# Patient Record
Sex: Female | Born: 1985 | Race: White | Hispanic: Refuse to answer | Marital: Married | Smoking: Never smoker
Health system: Southern US, Community
[De-identification: ages and names within clinical notes are randomized; demographics above are authoritative.]

## PROBLEM LIST (undated history)

## (undated) DIAGNOSIS — Z789 Other specified health status: Secondary | ICD-10-CM

## (undated) DIAGNOSIS — O24419 Gestational diabetes mellitus in pregnancy, unspecified control: Secondary | ICD-10-CM

---

## 2017-05-15 LAB — OB RESULTS CONSOLE ABO/RH: RH TYPE: POSITIVE

## 2017-05-15 LAB — OB RESULTS CONSOLE HEPATITIS B SURFACE ANTIGEN: HEP B S AG: NEGATIVE

## 2017-05-15 LAB — OB RESULTS CONSOLE RUBELLA ANTIBODY, IGM: Rubella: IMMUNE

## 2017-05-15 LAB — OB RESULTS CONSOLE ANTIBODY SCREEN: Antibody Screen: NEGATIVE

## 2017-05-15 LAB — OB RESULTS CONSOLE GC/CHLAMYDIA
Chlamydia: NEGATIVE
Gonorrhea: NEGATIVE

## 2017-05-15 LAB — OB RESULTS CONSOLE HIV ANTIBODY (ROUTINE TESTING): HIV: NONREACTIVE

## 2017-05-15 LAB — OB RESULTS CONSOLE RPR: RPR: NONREACTIVE

## 2017-10-12 LAB — OB RESULTS CONSOLE GBS: STREP GROUP B AG: NEGATIVE

## 2017-10-16 ENCOUNTER — Encounter (HOSPITAL_COMMUNITY): Payer: Self-pay | Admitting: *Deleted

## 2017-10-17 ENCOUNTER — Encounter (HOSPITAL_COMMUNITY): Payer: Self-pay

## 2017-10-27 NOTE — Patient Instructions (Signed)
10/31/2017 Darrelyn HillockJillian Chesney  10/27/2017   Your procedure is scheduled on:  10/31/2017  [facility] at  0530AM.  Pick up the phone at the desk and dial [phone]  Call this number if you have problems the morning of surgery:[phone]  Remember:   Do not eat food:(After Midnight) Desps de medianoche.  Do not drink clear liquids: (After Midnight) Desps de medianoche.  Take these medicines the morning of surgery with A SIP OF WATER: none   Do not wear jewelry, make-up or nail polish.  Do not wear lotions, powders, or perfumes. Do not wear deodorant.  Do not shave 48 hours prior to surgery.  Do not bring valuables to the hospital.  [facility] is not   responsible for any belongings or valuables brought to the hospital.  Contacts, dentures or bridgework may not be worn into surgery.  Leave suitcase in the car. After surgery it may be brought to your room.  For patients admitted to the hospital, checkout time is 11:00 AM the day of              discharge.    N/A   Please read over the following fact sheets that you were given:   Surgical Site Infection Prevention

## 2017-10-30 ENCOUNTER — Inpatient Hospital Stay (HOSPITAL_COMMUNITY): Payer: Medicaid Other | Admitting: Anesthesiology

## 2017-10-30 ENCOUNTER — Encounter (HOSPITAL_COMMUNITY): Payer: Self-pay | Admitting: *Deleted

## 2017-10-30 ENCOUNTER — Encounter (HOSPITAL_COMMUNITY)
Admission: AD | Disposition: A | Discharge status: Home / Self Care | Payer: Self-pay | Source: Ambulatory Visit | Attending: Obstetrics and Gynecology

## 2017-10-30 ENCOUNTER — Inpatient Hospital Stay (HOSPITAL_COMMUNITY)
Admission: AD | Admit: 2017-10-30 | Discharge: 2017-11-02 | DRG: 788 | Disposition: A | Discharge status: Home / Self Care | Payer: Medicaid Other | Source: Ambulatory Visit | Attending: Obstetrics and Gynecology | Admitting: Obstetrics and Gynecology

## 2017-10-30 ENCOUNTER — Encounter (HOSPITAL_COMMUNITY)
Admission: RE | Admit: 2017-10-30 | Discharge: 2017-10-30 | Disposition: A | Discharge status: Home / Self Care | Payer: Self-pay | Source: Ambulatory Visit | Attending: Obstetrics and Gynecology | Admitting: Obstetrics and Gynecology

## 2017-10-30 DIAGNOSIS — O34211 Maternal care for low transverse scar from previous cesarean delivery: Secondary | ICD-10-CM | POA: Diagnosis present

## 2017-10-30 DIAGNOSIS — O4292 Full-term premature rupture of membranes, unspecified as to length of time between rupture and onset of labor: Secondary | ICD-10-CM | POA: Diagnosis present

## 2017-10-30 DIAGNOSIS — Z3A38 38 weeks gestation of pregnancy: Secondary | ICD-10-CM

## 2017-10-30 HISTORY — DX: Other specified health status: Z78.9

## 2017-10-30 LAB — CBC
HCT: 33.1 % — ABNORMAL LOW (ref 36.0–46.0)
Hemoglobin: 11.2 g/dL — ABNORMAL LOW (ref 12.0–15.0)
MCH: 28.1 pg (ref 26.0–34.0)
MCHC: 33.8 g/dL (ref 30.0–36.0)
MCV: 83.2 fL (ref 78.0–100.0)
Platelets: 491 10*3/uL — ABNORMAL HIGH (ref 150–400)
RBC: 3.98 MIL/uL (ref 3.87–5.11)
RDW: 14.5 % (ref 11.5–15.5)
WBC: 12.8 10*3/uL — ABNORMAL HIGH (ref 4.0–10.5)

## 2017-10-30 LAB — TYPE AND SCREEN
ABO/RH(D): O POS
Antibody Screen: NEGATIVE

## 2017-10-30 LAB — POCT FERN TEST: POCT FERN TEST: POSITIVE

## 2017-10-30 LAB — ABO/RH: ABO/RH(D): O POS

## 2017-10-30 LAB — RPR: RPR: NONREACTIVE

## 2017-10-30 SURGERY — Surgical Case
Anesthesia: Epidural

## 2017-10-30 MED ORDER — MAGNESIUM HYDROXIDE 400 MG/5ML PO SUSP: 30.0000 mL | ORAL | Status: DC | PRN

## 2017-10-30 MED ORDER — ACETAMINOPHEN 325 MG PO TABS: 650.0000 mg | ORAL_TABLET | ORAL | Status: DC | PRN

## 2017-10-30 MED ORDER — IBUPROFEN 600 MG PO TABS
600.0000 mg | ORAL_TABLET | Freq: Four times a day (QID) | ORAL | Status: DC
  Administered 2017-10-31 – 2017-11-02 (×9): 600 mg via ORAL
  Filled 2017-10-30 (×9): qty 1

## 2017-10-30 MED ORDER — DIPHENHYDRAMINE HCL 25 MG PO CAPS: 25.0000 mg | ORAL_CAPSULE | ORAL | Status: DC | PRN

## 2017-10-30 MED ORDER — ONDANSETRON HCL 4 MG/2ML IJ SOLN
INTRAMUSCULAR | Status: DC | PRN
  Administered 2017-10-30: 4 mg via INTRAVENOUS

## 2017-10-30 MED ORDER — WITCH HAZEL-GLYCERIN EX PADS: 1.0000 "application " | MEDICATED_PAD | CUTANEOUS | Status: DC | PRN

## 2017-10-30 MED ORDER — NALOXONE HCL 4 MG/10ML IJ SOLN: 1.0000 ug/kg/h | INTRAVENOUS | Status: DC | PRN

## 2017-10-30 MED ORDER — FENTANYL CITRATE (PF) 100 MCG/2ML IJ SOLN
INTRAMUSCULAR | Status: AC
  Filled 2017-10-30: qty 2

## 2017-10-30 MED ORDER — ONDANSETRON HCL 4 MG/2ML IJ SOLN
4.0000 mg | Freq: Four times a day (QID) | INTRAMUSCULAR | Status: DC | PRN
  Administered 2017-10-30: 4 mg via INTRAVENOUS
  Filled 2017-10-30: qty 2

## 2017-10-30 MED ORDER — FLEET ENEMA 7-19 GM/118ML RE ENEM: 1.0000 | ENEMA | RECTAL | Status: DC | PRN

## 2017-10-30 MED ORDER — OXYCODONE HCL 5 MG PO TABS: 10.0000 mg | ORAL_TABLET | ORAL | Status: DC | PRN

## 2017-10-30 MED ORDER — NALBUPHINE HCL 10 MG/ML IJ SOLN: 5.0000 mg | Freq: Once | INTRAMUSCULAR | Status: DC | PRN

## 2017-10-30 MED ORDER — PHENYLEPHRINE 40 MCG/ML (10ML) SYRINGE FOR IV PUSH (FOR BLOOD PRESSURE SUPPORT)
PREFILLED_SYRINGE | INTRAVENOUS | Status: DC | PRN
  Administered 2017-10-30 (×15): 80 ug via INTRAVENOUS

## 2017-10-30 MED ORDER — SCOPOLAMINE 1 MG/3DAYS TD PT72
1.0000 | MEDICATED_PATCH | TRANSDERMAL | Status: DC
  Administered 2017-10-30: 1.5 mg via TRANSDERMAL
  Filled 2017-10-30: qty 1

## 2017-10-30 MED ORDER — MEASLES, MUMPS & RUBELLA VAC ~~LOC~~ INJ: 0.5000 mL | INJECTION | Freq: Once | SUBCUTANEOUS | Status: DC

## 2017-10-30 MED ORDER — OXYTOCIN 10 UNIT/ML IJ SOLN
INTRAVENOUS | Status: DC | PRN
  Administered 2017-10-30: 40 [IU] via INTRAVENOUS

## 2017-10-30 MED ORDER — SOD CITRATE-CITRIC ACID 500-334 MG/5ML PO SOLN
30.0000 mL | ORAL | Status: DC | PRN
  Administered 2017-10-30: 30 mL via ORAL
  Filled 2017-10-30: qty 15

## 2017-10-30 MED ORDER — EPHEDRINE 5 MG/ML INJ: 10.0000 mg | INTRAVENOUS | Status: DC | PRN

## 2017-10-30 MED ORDER — COCONUT OIL OIL
1.0000 "application " | TOPICAL_OIL | Status: DC | PRN
  Administered 2017-11-01: 1 via TOPICAL
  Filled 2017-10-30: qty 120

## 2017-10-30 MED ORDER — KETOROLAC TROMETHAMINE 30 MG/ML IJ SOLN: 30.0000 mg | Freq: Four times a day (QID) | INTRAMUSCULAR | Status: AC | PRN

## 2017-10-30 MED ORDER — SIMETHICONE 80 MG PO CHEW: 80.0000 mg | CHEWABLE_TABLET | ORAL | Status: DC | PRN

## 2017-10-30 MED ORDER — PHENYLEPHRINE 40 MCG/ML (10ML) SYRINGE FOR IV PUSH (FOR BLOOD PRESSURE SUPPORT)
80.0000 ug | PREFILLED_SYRINGE | INTRAVENOUS | Status: DC | PRN
  Administered 2017-10-30: 80 ug via INTRAVENOUS
  Filled 2017-10-30: qty 10

## 2017-10-30 MED ORDER — LACTATED RINGERS IV SOLN
INTRAVENOUS | Status: DC
  Administered 2017-10-30 – 2017-10-31 (×2): via INTRAVENOUS

## 2017-10-30 MED ORDER — TETANUS-DIPHTH-ACELL PERTUSSIS 5-2.5-18.5 LF-MCG/0.5 IM SUSP: 0.5000 mL | Freq: Once | INTRAMUSCULAR | Status: DC

## 2017-10-30 MED ORDER — OXYTOCIN 40 UNITS IN LACTATED RINGERS INFUSION - SIMPLE MED: 2.5000 [IU]/h | INTRAVENOUS | Status: DC

## 2017-10-30 MED ORDER — LIDOCAINE-EPINEPHRINE (PF) 2 %-1:200000 IJ SOLN
INTRAMUSCULAR | Status: DC | PRN
  Administered 2017-10-30 (×4): 5 mL via EPIDURAL

## 2017-10-30 MED ORDER — BUTORPHANOL TARTRATE 1 MG/ML IJ SOLN: 1.0000 mg | INTRAMUSCULAR | Status: DC | PRN

## 2017-10-30 MED ORDER — LACTATED RINGERS IV SOLN: 500.0000 mL | Freq: Once | INTRAVENOUS | Status: DC

## 2017-10-30 MED ORDER — DIPHENHYDRAMINE HCL 25 MG PO CAPS: 25.0000 mg | ORAL_CAPSULE | Freq: Four times a day (QID) | ORAL | Status: DC | PRN

## 2017-10-30 MED ORDER — FENTANYL CITRATE (PF) 100 MCG/2ML IJ SOLN
INTRAMUSCULAR | Status: DC | PRN
  Administered 2017-10-30 (×2): 50 ug via INTRAVENOUS

## 2017-10-30 MED ORDER — DIPHENHYDRAMINE HCL 50 MG/ML IJ SOLN: 12.5000 mg | INTRAMUSCULAR | Status: DC | PRN

## 2017-10-30 MED ORDER — LACTATED RINGERS IV SOLN
INTRAVENOUS | Status: DC
  Administered 2017-10-30 (×3): via INTRAVENOUS

## 2017-10-30 MED ORDER — NALOXONE HCL 0.4 MG/ML IJ SOLN: 0.4000 mg | INTRAMUSCULAR | Status: DC | PRN

## 2017-10-30 MED ORDER — MENTHOL 3 MG MT LOZG: 1.0000 | LOZENGE | OROMUCOSAL | Status: DC | PRN

## 2017-10-30 MED ORDER — OXYCODONE-ACETAMINOPHEN 5-325 MG PO TABS: 2.0000 | ORAL_TABLET | ORAL | Status: DC | PRN

## 2017-10-30 MED ORDER — FENTANYL 2.5 MCG/ML BUPIVACAINE 1/10 % EPIDURAL INFUSION (WH - ANES)
14.0000 mL/h | INTRAMUSCULAR | Status: DC | PRN
  Administered 2017-10-30: 14 mL/h via EPIDURAL
  Filled 2017-10-30 (×2): qty 100

## 2017-10-30 MED ORDER — TERBUTALINE SULFATE 1 MG/ML IJ SOLN
0.2500 mg | Freq: Once | INTRAMUSCULAR | Status: DC | PRN
Start: 2017-10-30 — End: 2017-10-30

## 2017-10-30 MED ORDER — DIBUCAINE 1 % RE OINT: 1.0000 "application " | TOPICAL_OINTMENT | RECTAL | Status: DC | PRN

## 2017-10-30 MED ORDER — MEPERIDINE HCL 25 MG/ML IJ SOLN: 6.2500 mg | INTRAMUSCULAR | Status: DC | PRN

## 2017-10-30 MED ORDER — OXYTOCIN 40 UNITS IN LACTATED RINGERS INFUSION - SIMPLE MED
1.0000 m[IU]/min | INTRAVENOUS | Status: DC
  Administered 2017-10-30: 1 m[IU]/min via INTRAVENOUS
  Filled 2017-10-30: qty 1000

## 2017-10-30 MED ORDER — KETOROLAC TROMETHAMINE 30 MG/ML IJ SOLN
30.0000 mg | Freq: Four times a day (QID) | INTRAMUSCULAR | Status: AC | PRN
  Administered 2017-10-30: 30 mg via INTRAMUSCULAR

## 2017-10-30 MED ORDER — OXYCODONE-ACETAMINOPHEN 5-325 MG PO TABS: 1.0000 | ORAL_TABLET | ORAL | Status: DC | PRN

## 2017-10-30 MED ORDER — LACTATED RINGERS IV SOLN
500.0000 mL | INTRAVENOUS | Status: DC | PRN
  Administered 2017-10-30: 1000 mL via INTRAVENOUS

## 2017-10-30 MED ORDER — FENTANYL CITRATE (PF) 100 MCG/2ML IJ SOLN
25.0000 ug | INTRAMUSCULAR | Status: DC | PRN
  Administered 2017-10-30: 25 ug via INTRAVENOUS
  Administered 2017-10-30: 50 ug via INTRAVENOUS
  Administered 2017-10-30: 25 ug via INTRAVENOUS

## 2017-10-30 MED ORDER — LIDOCAINE HCL (PF) 1 % IJ SOLN
INTRAMUSCULAR | Status: DC | PRN
  Administered 2017-10-30 (×2): 4 mL

## 2017-10-30 MED ORDER — NALBUPHINE HCL 10 MG/ML IJ SOLN: 5.0000 mg | INTRAMUSCULAR | Status: DC | PRN

## 2017-10-30 MED ORDER — SENNOSIDES-DOCUSATE SODIUM 8.6-50 MG PO TABS
2.0000 | ORAL_TABLET | ORAL | Status: DC
  Administered 2017-11-01 (×2): 2 via ORAL
  Filled 2017-10-30 (×2): qty 2

## 2017-10-30 MED ORDER — ONDANSETRON HCL 4 MG/2ML IJ SOLN
4.0000 mg | Freq: Three times a day (TID) | INTRAMUSCULAR | Status: DC | PRN
  Administered 2017-10-31: 4 mg via INTRAVENOUS
  Filled 2017-10-30 (×2): qty 2

## 2017-10-30 MED ORDER — LIDOCAINE HCL (PF) 1 % IJ SOLN: 30.0000 mL | INTRAMUSCULAR | Status: DC | PRN

## 2017-10-30 MED ORDER — PHENYLEPHRINE 8 MG IN D5W 100 ML (0.08MG/ML) PREMIX OPTIME: INJECTION | INTRAVENOUS | Status: DC | PRN

## 2017-10-30 MED ORDER — PHENYLEPHRINE 40 MCG/ML (10ML) SYRINGE FOR IV PUSH (FOR BLOOD PRESSURE SUPPORT)
80.0000 ug | PREFILLED_SYRINGE | INTRAVENOUS | Status: DC | PRN
  Filled 2017-10-30: qty 10

## 2017-10-30 MED ORDER — CEFAZOLIN SODIUM-DEXTROSE 2-4 GM/100ML-% IV SOLN
2.0000 g | INTRAVENOUS | Status: AC
  Administered 2017-10-30: 2 g via INTRAVENOUS
  Filled 2017-10-30: qty 100

## 2017-10-30 MED ORDER — OXYTOCIN 40 UNITS IN LACTATED RINGERS INFUSION - SIMPLE MED: 2.5000 [IU]/h | INTRAVENOUS | Status: AC

## 2017-10-30 MED ORDER — LACTATED RINGERS IV SOLN
INTRAVENOUS | Status: DC
  Administered 2017-10-30: 16:00:00 via INTRAVENOUS

## 2017-10-30 MED ORDER — METOCLOPRAMIDE HCL 5 MG/ML IJ SOLN
10.0000 mg | Freq: Once | INTRAMUSCULAR | Status: DC | PRN
  Filled 2017-10-30: qty 2

## 2017-10-30 MED ORDER — OXYCODONE HCL 5 MG PO TABS
5.0000 mg | ORAL_TABLET | ORAL | Status: DC | PRN
  Administered 2017-11-01 (×2): 5 mg via ORAL
  Filled 2017-10-30 (×2): qty 1

## 2017-10-30 MED ORDER — FENTANYL 2.5 MCG/ML BUPIVACAINE 1/10 % EPIDURAL INFUSION (WH - ANES)
14.0000 mL/h | INTRAMUSCULAR | Status: DC | PRN
  Administered 2017-10-30: 14 mL/h via EPIDURAL

## 2017-10-30 MED ORDER — PRENATAL MULTIVITAMIN CH
1.0000 | ORAL_TABLET | Freq: Every day | ORAL | Status: DC
  Administered 2017-10-31 – 2017-11-01 (×2): 1 via ORAL
  Filled 2017-10-30 (×2): qty 1

## 2017-10-30 MED ORDER — SODIUM CHLORIDE 0.9 % IR SOLN
Status: DC | PRN
  Administered 2017-10-30: 1

## 2017-10-30 MED ORDER — SODIUM CHLORIDE 0.9% FLUSH: 3.0000 mL | INTRAVENOUS | Status: DC | PRN

## 2017-10-30 MED ORDER — ZOLPIDEM TARTRATE 5 MG PO TABS: 5.0000 mg | ORAL_TABLET | Freq: Every evening | ORAL | Status: DC | PRN

## 2017-10-30 MED ORDER — MORPHINE SULFATE-NACL 0.5-0.9 MG/ML-% IV SOSY
PREFILLED_SYRINGE | INTRAVENOUS | Status: DC | PRN
  Administered 2017-10-30: 4 mg via EPIDURAL

## 2017-10-30 MED ORDER — OXYTOCIN BOLUS FROM INFUSION: 500.0000 mL | Freq: Once | INTRAVENOUS | Status: DC

## 2017-10-30 SURGICAL SUPPLY — 33 items
BENZOIN TINCTURE PRP APPL 2/3 (GAUZE/BANDAGES/DRESSINGS) ×3 IMPLANT
CHLORAPREP W/TINT 26ML (MISCELLANEOUS) ×3 IMPLANT
CLAMP CORD UMBIL (MISCELLANEOUS) IMPLANT
CLOSURE STERI STRIP 1/2 X4 (GAUZE/BANDAGES/DRESSINGS) ×3 IMPLANT
CLOTH BEACON ORANGE TIMEOUT ST (SAFETY) ×3 IMPLANT
DRSG OPSITE POSTOP 4X10 (GAUZE/BANDAGES/DRESSINGS) ×3 IMPLANT
ELECT REM PT RETURN 9FT ADLT (ELECTROSURGICAL) ×3
ELECTRODE REM PT RTRN 9FT ADLT (ELECTROSURGICAL) ×1 IMPLANT
EXTRACTOR VACUUM KIWI (MISCELLANEOUS) IMPLANT
EXTRACTOR VACUUM M CUP 4 TUBE (SUCTIONS) IMPLANT
EXTRACTOR VACUUM M CUP 4' TUBE (SUCTIONS)
GLOVE BIOGEL PI IND STRL 7.0 (GLOVE) ×1 IMPLANT
GLOVE BIOGEL PI INDICATOR 7.0 (GLOVE) ×2
GLOVE ORTHO TXT STRL SZ7.5 (GLOVE) ×3 IMPLANT
GOWN STRL REUS W/TWL LRG LVL3 (GOWN DISPOSABLE) ×6 IMPLANT
KIT ABG SYR 3ML LUER SLIP (SYRINGE) IMPLANT
NEEDLE HYPO 25X5/8 SAFETYGLIDE (NEEDLE) ×3 IMPLANT
NS IRRIG 1000ML POUR BTL (IV SOLUTION) ×3 IMPLANT
PACK C SECTION WH (CUSTOM PROCEDURE TRAY) ×3 IMPLANT
PAD OB MATERNITY 4.3X12.25 (PERSONAL CARE ITEMS) ×3 IMPLANT
PENCIL SMOKE EVAC W/HOLSTER (ELECTROSURGICAL) ×3 IMPLANT
RTRCTR C-SECT PINK 25CM LRG (MISCELLANEOUS) ×3 IMPLANT
SUT CHROMIC 1 CTX 36 (SUTURE) ×6 IMPLANT
SUT PLAIN 0 NONE (SUTURE) IMPLANT
SUT PLAIN 2 0 XLH (SUTURE) IMPLANT
SUT VIC AB 0 CT1 27 (SUTURE) ×4
SUT VIC AB 0 CT1 27XBRD ANBCTR (SUTURE) ×2 IMPLANT
SUT VIC AB 2-0 CT1 (SUTURE) ×3 IMPLANT
SUT VIC AB 2-0 CT1 27 (SUTURE) ×2
SUT VIC AB 2-0 CT1 TAPERPNT 27 (SUTURE) ×1 IMPLANT
SUT VIC AB 4-0 KS 27 (SUTURE) IMPLANT
TOWEL OR 17X24 6PK STRL BLUE (TOWEL DISPOSABLE) ×3 IMPLANT
TRAY FOLEY BAG SILVER LF 14FR (SET/KITS/TRAYS/PACK) ×3 IMPLANT

## 2017-10-30 NOTE — Progress Notes (Signed)
Feeling some ctx with epidural Afeb, VSS FHT- 120-130, mod variability, some accels, some variable decels, Cat II, ctx not tracing consistently on 8 mu/min pitocin VE-foley still snug in cervix Continue pitocin with foley, monitor progress and FHT

## 2017-10-30 NOTE — Anesthesia Procedure Notes (Signed)
Epidural Patient location during procedure: OB Start time: 10/30/2017 5:58 AM End time: 10/30/2017 6:08 AM  Staffing Anesthesiologist: Lewie LoronGermeroth, Cedrica Brune, MD Performed: anesthesiologist   Preanesthetic Checklist Completed: patient identified, pre-op evaluation, timeout performed, IV checked, risks and benefits discussed and monitors and equipment checked  Epidural Patient position: sitting Prep: site prepped and draped and DuraPrep Patient monitoring: heart rate, continuous pulse ox and blood pressure Approach: midline Location: L3-L4 Injection technique: LOR air and LOR saline  Needle:  Needle type: Tuohy  Needle gauge: 17 G Needle length: 9 cm Needle insertion depth: 6 cm Catheter type: closed end flexible Catheter size: 19 Gauge Catheter at skin depth: 11 cm Test dose: negative  Assessment Sensory level: T8 Events: blood not aspirated, injection not painful, no injection resistance, negative IV test and no paresthesia  Additional Notes Reason for block:procedure for pain

## 2017-10-30 NOTE — MAU Note (Signed)
PT states water broke at 2:30am-clear fluid. Contractions that she has not timed. Pt denies vaginal bleeding. Reports good fetal movement. States cervix was 2cm 2 weeks ago.

## 2017-10-30 NOTE — Transfer of Care (Signed)
Immediate Anesthesia Transfer of Care Note  Patient: Robin Conley  Procedure(s) Performed: REPEAT CESAREAN SECTION (N/A )  Patient Location: PACU  Anesthesia Type:Epidural  Level of Consciousness: sedated  Airway & Oxygen Therapy: Patient Spontanous Breathing  Post-op Assessment: Report given to RN  Post vital signs: Reviewed and stable  Last Vitals:  Vitals Value Taken Time  BP    Temp    Pulse 91 10/30/2017  5:45 PM  Resp 17 10/30/2017  5:45 PM  SpO2 100 % 10/30/2017  5:45 PM  Vitals shown include unvalidated device data.  Last Pain:  Vitals:   10/30/17 1601  TempSrc: Oral  PainSc:          Complications: No apparent anesthesia complications

## 2017-10-30 NOTE — Progress Notes (Signed)
Pt turned to rt side, pt feel nauseated

## 2017-10-30 NOTE — Anesthesia Preprocedure Evaluation (Addendum)
Anesthesia Evaluation  Patient identified by MRN, date of birth, ID band Patient awake    Reviewed: Allergy & Precautions, NPO status , Patient's Chart, lab work & pertinent test results  Airway Mallampati: II  TM Distance: >3 FB Neck ROM: Full    Dental no notable dental hx.    Pulmonary neg pulmonary ROS,    Pulmonary exam normal breath sounds clear to auscultation       Cardiovascular negative cardio ROS Normal cardiovascular exam Rhythm:Regular Rate:Normal     Neuro/Psych negative neurological ROS  negative psych ROS   GI/Hepatic negative GI ROS, Neg liver ROS, GERD  ,  Endo/Other  negative endocrine ROS  Renal/GU negative Renal ROS     Musculoskeletal negative musculoskeletal ROS (+)   Abdominal   Peds  Hematology negative hematology ROS (+)   Anesthesia Other Findings   Reproductive/Obstetrics (+) Pregnancy Previous C/section                            Anesthesia Physical Anesthesia Plan  ASA: II and emergent  Anesthesia Plan: Epidural   Post-op Pain Management:    Induction:   PONV Risk Score and Plan: Scopolamine patch - Pre-op, Ondansetron, Dexamethasone and Treatment may vary due to age or medical condition  Airway Management Planned: Natural Airway  Additional Equipment:   Intra-op Plan:   Post-operative Plan:   Informed Consent: I have reviewed the patients History and Physical, chart, labs and discussed the procedure including the risks, benefits and alternatives for the proposed anesthesia with the patient or authorized representative who has indicated his/her understanding and acceptance.     Plan Discussed with: CRNA, Anesthesiologist and Surgeon  Anesthesia Plan Comments: (Repeat C/Section for FHR decels. Will use epidural for C/Section. M. Malen GauzeFoster, MD)       Anesthesia Quick Evaluation

## 2017-10-30 NOTE — Op Note (Signed)
Preoperative diagnosis: Intrauterine pregnancy at 38+ weeks, previous c-section x 2, PROM, FHR decels Postoperative diagnosis: Same Procedure: Repeat low transverse cesarean section without extensions Surgeon: Lavina Hammanodd Cierria Height M.D. Anesthesia: Epidural Findings: Patient had normal gravid anatomy and delivered a viable female infant with Apgars of 8 and 9 weight pending Estimated blood loss: 841 cc Specimens: Placenta sent to labor and delivery Complications: None  Procedure in detail: The patient was taken to the operating room and placed in the dorsosupine position. Her previously placed epidural was dosed appropriately. Abdomen was then prepped and draped in the usual sterile fashion, a foley catheter had previously been inserted. The level of her anesthesia was found to be adequate. Abdomen was entered via a standard Pfannenstiel incision through her previous scar. Once the peritoneal cavity was entered the Alexis disposable self-retaining retractor was placed and good visualization was achieved, omentum was taken down from the anterior peritoneum with Bovie to free it, no other adhesions were noted. A 4 cm transverse incision was then made in the lower uterine segment pushing the bladder inferior. Once the uterine cavity was entered the incision was extended digitally. The fetal vertex was grasped and delivered through the incision atraumatically. Mouth and nares were suctioned. The remainder of the infant then delivered atraumatically. Cord was doubly clamped and cut and the infant handed to the awaiting pediatric team. Cord blood was obtained. The placenta delivered spontaneously. Uterus was wiped dry with clean lap pad and all clots and debris were removed. Uterine incision was inspected and found to be free of extensions. Uterine incision was closed in 1 layer with running locking #1 Chromic.  Uterine incision was inspected and found to be hemostatic. Bleeding from serosal edges was controlled with  electrocautery. The Alexis retractor was removed. Subfascial space was irrigated and made hemostatic with electrocautery. Peritoneum was closed with 2-0 Vicryl.  Fascia was closed in running fashion starting at both ends and meeting in the middle with 0 Vicryl. Subcutaneous tissue was then irrigated and made hemostatic with electrocautery, then closed with running 2-0 plain gut. Skin was closed with running 4-0 Vicryl subcuticular suture followed by steri-strips and a sterile dressing. Patient tolerated the procedure well and was taken to the recovery in stable condition. Counts were correct x2, she received Ancef 2 g IV at the beginning of the procedure and she had PAS hose on throughout the procedure.

## 2017-10-30 NOTE — Progress Notes (Signed)
FHR with repetitive late decels, still moderate variability Discussed with patient, still remote from vaginal delivery, recommended repeat c-section and she agrees.  Discussed procedure and risks.  Will proceed when OR is ready.

## 2017-10-30 NOTE — Progress Notes (Signed)
Comfortable with epidural Afeb, VSS FHT- cat I, 120s, irreg ctx VE-1/30/-3, vtx, intracervical foley placed Will try foley bulb and low dose pitocin, monitor progress

## 2017-10-30 NOTE — H&P (Signed)
Robin Conley is a 32 y.o. female [phone] at 38+ with history of LTCS x 2, with SROM.  Pt desires TOLAC, has consent signed, have discussed w pt r/b/a.  Pt dated by early [place].  Declined genetic screening, flu and Tdap.  Normal anatomy on follow up US.  Pt's rLTCS set up for 3/26 if didn't labor prior.  ROM confirmed in MAU.  D/W pt r/b/a of TOLAC/VBAC - paper signed, questions answered.  States ROM at 2:30am, clear.     OB History    Gravida  4   Para  3   Term  3   Preterm      AB      Living  3     SAB      TAB      Ectopic      Multiple      Live Births  3         G1 06/2008 SVD female 5#8 G2 10/2009 LTCS for fetal stress, 6#, female G3 04/2012 LTCS as scheduled repeat, per pt not offered TOLAC G4 present  No abn pap, last 05/15/17 +STD - h/o Chl  Past Medical History:  Diagnosis Date  . Medical history non-contributory   RA per office records  Past Surgical History:  Procedure Laterality Date  . CESAREAN SECTION    LTCS x 2  Family History: family history includes Diabetes in her mother; Heart disease in her father and mother; Hypertension in her father and mother. Social History:  reports that she has never smoked. She has never used smokeless tobacco. She reports that she does not drink alcohol or use drugs.single, manager  Meds PNV All NKDA     Maternal Diabetes: No Genetic Screening: Declined Maternal Ultrasounds/Referrals: Normal Fetal Ultrasounds or other Referrals:  None Maternal Substance Abuse:  No Significant Maternal Medications:  None Significant Maternal Lab Results:  Lab values include: Group B Strep negative Other Comments:  None  Review of Systems  Constitutional: Negative.   HENT: Negative.   Eyes: Negative.   Respiratory: Negative.   Cardiovascular: Negative.   Gastrointestinal: Negative.   Genitourinary: Negative.   Musculoskeletal: Negative.   Skin: Negative.   Neurological: Negative.   Psychiatric/Behavioral: Negative.     Maternal Medical History:  Reason for admission: Rupture of membranes.   Contractions: Frequency: regular.   Perceived severity is moderate.    Fetal activity: Perceived fetal activity is normal.    Prenatal complications: H/o LTCS x 2  Prenatal Complications - Diabetes: none.   FHTs 130's, mod var, + accels, category 1 toco Q2-59min  Dilation: Fingertip Effacement (%): Thick Exam by:: Rockwell Germany RN  Blood pressure 121/80, pulse 97, temperature 97.9 F (36.6 C), temperature source Oral, resp. rate 20, height 5\' 2"  (1.575 m), weight 87.5 kg (193 lb), last menstrual period 05/15/2017, SpO2 98 %. Maternal Exam:  Uterine Assessment: Contraction frequency is regular.   Abdomen: Patient reports no abdominal tenderness. Surgical scars: low transverse.   Fundal height is appropriate for gestation.   Estimated fetal weight is 8#.   Fetal presentation: vertex  Introitus: Normal vulva. Normal vagina.    Physical Exam  Constitutional: She is oriented to person, place, and time. She appears well-developed and well-nourished.  HENT:  Head: Normocephalic and atraumatic.  Cardiovascular: Normal rate and regular rhythm.  Respiratory: Effort normal and breath sounds normal. No respiratory distress. She has no wheezes.  GI: Soft. Bowel sounds are normal. She exhibits no distension. There is no tenderness.  Musculoskeletal: Normal range of motion.  Neurological: She is alert and oriented to person, place, and time.  Skin: Skin is warm and dry.  Psychiatric: She has a normal mood and affect. Her behavior is normal.    Prenatal labs: ABO, Rh: O/Positive/-- (10/08 0000) Antibody: Negative (10/08 0000) Rubella: Immune (10/08 0000) RPR: Nonreactive (10/08 0000)  HBsAg: Negative (10/08 0000)  HIV: Non-reactive (10/08 0000)  GBS:  negative  Hgb 11.4/Plt 316/Ur cx neg/Chl neg/GC neg/Pap WNL/ Hgb electro WNL/glucola 100  Nl anat x limited heart and face, female, aqnt plac F/u  completes anatomy  Assessment/Plan: 31yo G4P3003 at 38+ with SROM, h/o LTCS x 2 Unfavorable cervix - will place foley bulb if no cervical change D/w pt r/b/a of TOLAC - paper signed Cont close monitoring. Epidural, pt requests before foley bulb placed   SVE exam FT/50/-3, vtx  Naod Sweetland Bovard-Stuckert 10/30/2017, 4:51 AM

## 2017-10-31 ENCOUNTER — Other Ambulatory Visit: Payer: Self-pay

## 2017-10-31 ENCOUNTER — Inpatient Hospital Stay (HOSPITAL_COMMUNITY): Admission: RE | Admit: 2017-10-31 | Payer: Self-pay | Source: Ambulatory Visit | Admitting: Obstetrics and Gynecology

## 2017-10-31 ENCOUNTER — Encounter (HOSPITAL_COMMUNITY): Payer: Self-pay | Admitting: Obstetrics and Gynecology

## 2017-10-31 LAB — CBC
HCT: 32 % — ABNORMAL LOW (ref 36.0–46.0)
Hemoglobin: 10.7 g/dL — ABNORMAL LOW (ref 12.0–15.0)
MCH: 27.9 pg (ref 26.0–34.0)
MCHC: 33.4 g/dL (ref 30.0–36.0)
MCV: 83.6 fL (ref 78.0–100.0)
Platelets: 434 10*3/uL — ABNORMAL HIGH (ref 150–400)
RBC: 3.83 MIL/uL — ABNORMAL LOW (ref 3.87–5.11)
RDW: 14.5 % (ref 11.5–15.5)
WBC: 16.7 10*3/uL — ABNORMAL HIGH (ref 4.0–10.5)

## 2017-10-31 NOTE — Addendum Note (Signed)
Addendum  created 10/31/17 0739 by Shanon PayorGregory, Savannaha Stonerock M, CRNA   Sign clinical note

## 2017-10-31 NOTE — Lactation Note (Signed)
This note was copied from a baby's chart. Lactation Consultation Note  Patient Name: Robin Darrelyn HillockJillian Sayre ZOXWR'UToday's Date: 10/31/2017 Reason for consult: Initial assessment   P4 Baby 19 hours old.  First time breastfeeding. Baby has been sleepy.  Encouraged mother to place baby STS if she does not wake to cue and undress her. Reviewed hand expression prior to latching. Assisted w/ latching in football hold, breast compression. Intermittent swallows observed. Mom encouraged to feed baby 8-12 times/24 hours and with feeding cues.  Mom made aware of O/P services, breastfeeding support groups, community resources, and our phone # for post-discharge questions.     Maternal Data Has patient been taught Hand Expression?: Yes Does the patient have breastfeeding experience prior to this delivery?: No  Feeding Feeding Type: Breast Fed  LATCH Score Latch: Repeated attempts needed to sustain latch, nipple held in mouth throughout feeding, stimulation needed to elicit sucking reflex.  Audible Swallowing: A few with stimulation  Type of Nipple: Everted at rest and after stimulation  Comfort (Breast/Nipple): Soft / non-tender  Hold (Positioning): Assistance needed to correctly position infant at breast and maintain latch.  LATCH Score: 7  Interventions Interventions: Breast feeding basics reviewed;Assisted with latch;Skin to skin;Hand express;Adjust position;Support pillows  Lactation Tools Discussed/Used     Consult Status Consult Status: Follow-up Date: 11/01/17 Follow-up type: In-patient    Dahlia ByesBerkelhammer, Ruth [facility] 10/31/2017, 12:40 PM

## 2017-10-31 NOTE — Anesthesia Postprocedure Evaluation (Signed)
Anesthesia Post Note  Patient: Robin Conley  Procedure(s) Performed: REPEAT CESAREAN SECTION (N/A )     Patient location during evaluation: Mother Baby Anesthesia Type: Epidural Level of consciousness: awake and alert and oriented Pain management: pain level controlled Vital Signs Assessment: post-procedure vital signs reviewed and stable Respiratory status: spontaneous breathing and nonlabored ventilation Cardiovascular status: stable Postop Assessment: no headache, no backache, patient able to bend at knees, no signs of nausea or vomiting and adequate PO intake Anesthetic complications: no    Last Vitals:  Vitals:   10/31/17 0418 10/31/17 0649  BP:  (!) 101/57  Pulse:  69  Resp:  16  Temp:  36.7 C  SpO2: 100% 98%    Last Pain:  Vitals:   10/31/17 0649  TempSrc: Oral  PainSc: 4    Pain Goal:                 Madison HickmanGREGORY,Robin Conley

## 2017-10-31 NOTE — Anesthesia Postprocedure Evaluation (Signed)
Anesthesia Post Note  Patient: Robin HillockJillian Tesoro  Procedure(s) Performed: REPEAT CESAREAN SECTION (N/A )     Anesthesia Post Evaluation  Last Vitals:  Vitals:   10/31/17 0330 10/31/17 0418  BP:    Pulse:    Resp:    Temp:    SpO2: 100% 100%    Last Pain:  Vitals:   10/31/17 0239  TempSrc: Oral  PainSc: 0-No pain   Pain Goal:                 Anahita Cua A.

## 2017-10-31 NOTE — Progress Notes (Signed)
Subjective: Postpartum Day 1: Cesarean Delivery Patient reports incisional pain, tolerating PO and no problems voiding.  Nl lochia, pain controlled  Objective: Vital signs in last 24 hours: Temp:  [97.3 F (36.3 C)-98.3 F (36.8 C)] 98.1 F (36.7 C) (03/26 0649) Pulse Rate:  [56-105] 69 (03/26 0649) Resp:  [13-21] 16 (03/26 0649) BP: (85-125)/(47-105) 101/57 (03/26 0649) SpO2:  [96 %-100 %] 98 % (03/26 0649)  Physical Exam:  General: alert and no distress Lochia: appropriate Uterine Fundus: firm Incision: healing well DVT Evaluation: No evidence of DVT seen on physical exam.  Recent Labs    10/30/17 0445 10/31/17 0536  HGB 11.2* 10.7*  HCT 33.1* 32.0*    Assessment/Plan: Status post Cesarean section. Doing well postoperatively.  Continue current care.  Valine Drozdowski Bovard-Stuckert 10/31/2017, 8:02 AM

## 2017-11-01 LAB — BIRTH TISSUE RECOVERY COLLECTION (PLACENTA DONATION)

## 2017-11-01 NOTE — Plan of Care (Signed)
  Problem: Elimination: Goal: Will not experience complications related to bowel motility Outcome: Progressing Goal: Will not experience complications related to urinary retention Outcome: Progressing Note:  Pt has voided normal amts throughout day.     Problem: Activity: Goal: Will verbalize the importance of balancing activity with adequate rest periods Outcome: Progressing Goal: Ability to tolerate increased activity will improve 11/01/2017 1737 by Claudette LawsBurgess, Keirstin Musil D, RN Note:  Pt ambulates in room without difficulty.  Has been encouraged to ambulate in hallway this shift, however, she has not. 11/01/2017 1736 by Claudette LawsBurgess, Verenice Westrich D, RN Outcome: Progressing   Problem: Life Cycle: Goal: Chance of risk for complications during the postpartum period will decrease 11/01/2017 1737 by Princess PernaBurgess, Weyman Bogdon D, RN Note:  Fundas firm, lochia amts WNL. 11/01/2017 1736 by Claudette LawsBurgess, Deyonna Fitzsimmons D, RN Outcome: Progressing   Problem: Role Relationship: Goal: Ability to demonstrate positive interaction with newborn will improve 11/01/2017 1737 by Princess PernaBurgess, Christapher Gillian D, RN Note:  Pt has been observed holding infant skin to skin & caring for infant throughout the day. 11/01/2017 1736 by Claudette LawsBurgess, Shley Dolby D, RN Outcome: Progressing   Problem: Skin Integrity: Goal: Demonstration of wound healing without infection will improve 11/01/2017 1737 by Claudette LawsBurgess, Benna Arno D, RN Note:  No new drainage marked on honeycomb dressing.  Pt denies s/s of infection at incision site. 11/01/2017 1736 by Princess PernaBurgess, Nialah Saravia D, RN Outcome: Progressing

## 2017-11-01 NOTE — Progress Notes (Signed)
Subjective: Postpartum Day 2 Cesarean Delivery Patient reports incisional pain, tolerating PO and no problems voiding.  States is a bit more sore today  Objective: Vital signs in last 24 hours: Temp:  [97.6 F (36.4 C)-98.4 F (36.9 C)] 98.4 F (36.9 C) (03/27 0600) Pulse Rate:  [67-98] 74 (03/27 0600) Resp:  [16-18] 18 (03/27 0600) BP: (103-115)/(58-75) 115/75 (03/27 0600) SpO2:  [97 %-100 %] 98 % (03/26 1810)  Physical Exam:  General: alert and cooperative Lochia: appropriate Uterine Fundus: firm Incision: small amount of old drainage, intact and dry   Recent Labs    10/30/17 0445 10/31/17 0536  HGB 11.2* 10.7*  HCT 33.1* 32.0*    Assessment/Plan: Status post Cesarean section. Doing well postoperatively.  Continue current care. D/w pt duramorph has now worn off and ok for occasional oxycodone as needed  Oliver PilaKathy W Nial Hawe 11/01/2017, 9:24 AM

## 2017-11-01 NOTE — Plan of Care (Signed)
Progressing appropriately. Encouraged to call for assistance as needed with feeding, and for LATCH assessment. 

## 2017-11-01 NOTE — Plan of Care (Signed)
  Problem: Pain Managment: Goal: General experience of comfort will improve 11/01/2017 1740 by Claudette LawsBurgess, Kannen Moxey D, RN Outcome: Progressing Note:  Pt has required one dose of Oxy IR during this shift. Pain predominantly managed with around the clock Ibuprofen. 11/01/2017 1739 by Princess PernaBurgess, Bravery Ketcham D, RN Reactivated

## 2017-11-02 MED ORDER — OXYCODONE HCL 5 MG PO TABS: 5.0000 mg | ORAL_TABLET | ORAL | 0 refills | Status: DC | PRN

## 2017-11-02 MED ORDER — IBUPROFEN 600 MG PO TABS: 600.0000 mg | ORAL_TABLET | Freq: Four times a day (QID) | ORAL | 0 refills | Status: DC

## 2017-11-02 NOTE — Lactation Note (Signed)
This note was copied from a baby's chart. Lactation Consultation Note  Patient Name: Robin Conley: 11/02/2017 Reason for consult: Follow-up assessment;Infant weight loss  LC visit prior to discharge:  Infant is now 5265 hours of age. LC reviewed chart and noticed that mother has been bottle feeding since last night.  Mother stated she is giving her breasts a "break"  But plans to put the infant to breast as soon as possible.  LC offered to assess nipples/breasts but mother declined stating she felt okay with her plan.  She has recently finished pumping 35 mls of breast milk and is going to use that for the next feeding.  At present, infant is sleeping on back in [place].  Mother's breasts are full and filling with no engorgement noted.  Reviewed sore nipple/engorgement prevention and treatment with her.  Provided comfort gels with instructions for use.    Mother has a DEBP for home use.Mom encouraged to feed baby 8-12 times/24 hours and with feeding cues. Mom made aware of O/P services, breastfeeding support groups, community resources, and our phone # for post-discharge questions.    Consult Status Consult Status: Complete Conley: 11/02/17 Follow-up type: Call as needed    Jerah Esty R Lissie Hinesley 11/02/2017, 10:32 AM

## 2017-11-02 NOTE — Progress Notes (Signed)
POD #3 Sore but doing ok Afeb, VSS Abd- soft, fundus firm, incision intact D/c home

## 2017-11-02 NOTE — Discharge Instructions (Signed)
As per discharge pamphlet °

## 2017-11-02 NOTE — Discharge Summary (Signed)
OB Discharge Summary     Patient Name: Robin Conley DOB: 28-Apr-1986 MRN: [id number]  Date of admission: 10/30/2017 Delivering MD: Jackelyn Knife, Sanaia Jasso   Date of discharge: 11/02/2017  Admitting diagnosis: 39 WEEKS ROM  Intrauterine pregnancy: [redacted]w[redacted]d     Secondary diagnosis:  Active Problems:   Indication for care in labor or delivery      Discharge diagnosis: Term Pregnancy Delivered and FHR decels, previous c-section x 2                                                                                                Hospital course:  Onset of Labor With Unplanned C/S  32 y.o. yo [id number] at [redacted]w[redacted]d was admitted in Latent Labor on 10/30/2017 with SROM.  Had intracervical foley placed to ripen cervix with VE 1 cm dilated. Patient had a labor course significant for FHR decels. Membrane Rupture Time/Date: 2:30 AM ,10/30/2017   The patient went for cesarean section due to FHR decels remote from delivery, and delivered a Viable infant,10/30/2017  Details of operation can be found in separate operative note. Patient had an uncomplicated postpartum course.  She is ambulating,tolerating a regular diet, passing flatus, and urinating well.  Patient is discharged home in stable condition 11/02/17.  Physical exam  Vitals:   10/31/17 1810 11/01/17 0600 11/01/17 1833 11/02/17 0539  BP: 107/62 115/75 114/64 113/70  Pulse: 98 74 60 60  Resp: 18 18 18 18   Temp: 97.6 F (36.4 C) 98.4 F (36.9 C) 97.9 F (36.6 C) 98.4 F (36.9 C)  TempSrc: Oral Oral Oral Oral  SpO2: 98%  98%   Weight:      Height:       General: alert Lochia: appropriate Uterine Fundus: firm Incision: Healing well with no significant drainage  Labs: Lab Results  Component Value Date   WBC 16.7 (H) 10/31/2017   HGB 10.7 (L) 10/31/2017   HCT 32.0 (L) 10/31/2017   MCV 83.6 10/31/2017   PLT 434 (H) 10/31/2017   No flowsheet data found.  Discharge instruction: per After Visit Summary and "Baby and Me Booklet".  After visit  meds:  Allergies as of 11/02/2017   No Known Allergies     Medication List    STOP taking these medications   calcium carbonate 500 MG chewable tablet Commonly known as:  TUMS - dosed in mg elemental calcium     TAKE these medications   ibuprofen 600 MG tablet Commonly known as:  ADVIL,MOTRIN Take 1 tablet (600 mg total) by mouth every 6 (six) hours.   oxyCODONE 5 MG immediate release tablet Commonly known as:  Oxy IR/ROXICODONE Take 1 tablet (5 mg total) by mouth every 4 (four) hours as needed for severe pain.   PRENATAL PO Take 1 tablet by mouth daily.       Diet: routine diet  Activity: Advance as tolerated. Pelvic rest for 6 weeks.   Outpatient follow up:2 weeks  Newborn Data: Live born female  Birth Weight: 8 lb 5.2 oz (3775 g) APGAR: 8, 9  Newborn Delivery   Birth date/time:  10/30/2017 16:51:00  Delivery type:  C-Section, Low Transverse C-section categorization:  Repeat     Baby Feeding: Breast Disposition:home with mother   11/02/2017 Zenaida Nieceodd D Jamarien Rodkey, MD

## 2017-11-02 NOTE — Progress Notes (Signed)
Discharge instructions given to patient.  She has been seen by lactation, wic and visiting home nurse.  She has no complaints and seems ready for discharge.

## 2019-08-29 ENCOUNTER — Other Ambulatory Visit (HOSPITAL_COMMUNITY)
Admission: RE | Admit: 2019-08-29 | Discharge: 2019-08-29 | Disposition: A | Discharge status: Home / Self Care | Payer: 59 | Source: Ambulatory Visit | Attending: Family Medicine | Admitting: Family Medicine

## 2019-08-29 ENCOUNTER — Ambulatory Visit (INDEPENDENT_AMBULATORY_CARE_PROVIDER_SITE_OTHER): Payer: 59 | Admitting: Family Medicine

## 2019-08-29 ENCOUNTER — Encounter: Payer: Self-pay | Admitting: Family Medicine

## 2019-08-29 ENCOUNTER — Other Ambulatory Visit: Payer: Self-pay

## 2019-08-29 VITALS — BP 104/64 | HR 86 | Wt 189.1 lb

## 2019-08-29 DIAGNOSIS — Z98891 History of uterine scar from previous surgery: Secondary | ICD-10-CM | POA: Insufficient documentation

## 2019-08-29 DIAGNOSIS — O09892 Supervision of other high risk pregnancies, second trimester: Secondary | ICD-10-CM

## 2019-08-29 DIAGNOSIS — O0932 Supervision of pregnancy with insufficient antenatal care, second trimester: Secondary | ICD-10-CM

## 2019-08-29 DIAGNOSIS — O09899 Supervision of other high risk pregnancies, unspecified trimester: Secondary | ICD-10-CM

## 2019-08-29 DIAGNOSIS — O0930 Supervision of pregnancy with insufficient antenatal care, unspecified trimester: Secondary | ICD-10-CM | POA: Diagnosis present

## 2019-08-29 DIAGNOSIS — Z3A24 24 weeks gestation of pregnancy: Secondary | ICD-10-CM

## 2019-08-29 NOTE — Progress Notes (Signed)
Subjective:  Robin Conley is a [id number] [redacted]w[redacted]d being seen today for her first obstetrical visit.  Her obstetrical history is significant for vaginal delivery, followed by 3 cesarean sections. No other complications to pregnancies. FOB in spouse. Unplanned, but desired pregnancy.. Patient does intend to breast feed. Pregnancy history fully reviewed.  Patient reports no complaints.  BP 104/64   Pulse 86   Wt 189 lb 1.3 oz (85.8 kg)   LMP 03/12/2019 (Exact Date)   BMI 34.58 kg/m   HISTORY: OB History  Gravida Para Term Preterm AB Living  5 4 4     4   SAB TAB Ectopic Multiple Live Births        0 4    # Outcome Date GA Lbr Len/2nd Weight Sex Delivery Anes PTL Lv  5 Current           4 Term 10/30/17 [redacted]w[redacted]d  8 lb 5.2 oz (3.775 kg) F CS-LTranv EPI  LIV  3 Term 2013 [redacted]w[redacted]d  7 lb 11 oz (3.487 kg) F CS-LTranv   LIV  2 Term 2011 [redacted]w[redacted]d  6 lb (2.722 kg) M CS-LTranv   LIV  1 Term 2009 [redacted]w[redacted]d  5 lb 8 oz (2.495 kg) M Vag-Spont   LIV    Past Medical History:  Diagnosis Date  . Medical history non-contributory     Past Surgical History:  Procedure Laterality Date  . CESAREAN SECTION    . CESAREAN SECTION N/A 10/30/2017   Procedure: REPEAT CESAREAN SECTION;  Surgeon: Cheri Fowler, MD;  Location: [facility];  Service: Obstetrics;  Laterality: N/A;  Tracey RNFA    Family History  Problem Relation Age of Onset  . Diabetes Mother   . Hypertension Mother   . Heart disease Mother   . Hypertension Father   . Heart disease Father      Exam  BP 104/64   Pulse 86   Wt 189 lb 1.3 oz (85.8 kg)   LMP 03/12/2019 (Exact Date)   BMI 34.58 kg/m   Chaperone present during exam  CONSTITUTIONAL: Well-developed, well-nourished female in no acute distress.  HENT:  Normocephalic, atraumatic, External right and left ear normal. Oropharynx is clear and moist EYES: Conjunctivae and EOM are normal. Pupils are equal, round, and reactive to light. No scleral icterus.  NECK: Normal range of  motion, supple, no masses.  Normal thyroid.  CARDIOVASCULAR: Normal heart rate noted, regular rhythm RESPIRATORY: Clear to auscultation bilaterally. Effort and breath sounds normal, no problems with respiration noted. BREASTS: deferred ABDOMEN: Soft, normal bowel sounds, no distention noted.  No tenderness, rebound or guarding.  PELVIC: deferred MUSCULOSKELETAL: Normal range of motion. No tenderness.  No cyanosis, clubbing, or edema.  2+ distal pulses. SKIN: Skin is warm and dry. No rash noted. Not diaphoretic. No erythema. No pallor. NEUROLOGIC: Alert and oriented to person, place, and time. Normal reflexes, muscle tone coordination. No cranial nerve deficit noted. PSYCHIATRIC: Normal mood and affect. Normal behavior. Normal judgment and thought content.    Assessment:    Pregnancy: [id number] Patient Active Problem List   Diagnosis Date Noted  . Supervision of other high risk pregnancy, antepartum 08/29/2019  . Indication for care in labor or delivery 10/30/2017      Plan:   1. Supervision of other high risk pregnancy, antepartum FHT and FH normal. PNL Desires panorama Will schedule [place]  2. Insufficient prenatal care, unspecified trimester  3. History of cesarean delivery Plan for repeat cesarean delivery.     Problem list  reviewed and updated. 75% of 30 min visit spent on counseling and coordination of care.     Levie Heritage 08/29/2019

## 2019-08-30 LAB — GC/CHLAMYDIA PROBE AMP (~~LOC~~) NOT AT ARMC
Chlamydia: NEGATIVE
Comment: NEGATIVE
Comment: NORMAL
Neisseria Gonorrhea: NEGATIVE

## 2019-08-31 LAB — CULTURE, OB URINE

## 2019-08-31 LAB — URINE CULTURE, OB REFLEX: Organism ID, Bacteria: NO GROWTH

## 2019-09-10 LAB — OBSTETRIC PANEL, INCLUDING HIV
Antibody Screen: NEGATIVE
Basophils Absolute: 0 10*3/uL (ref 0.0–0.2)
Basos: 0 %
EOS (ABSOLUTE): 0 10*3/uL (ref 0.0–0.4)
Eos: 0 %
HIV Screen 4th Generation wRfx: NONREACTIVE
Hematocrit: 32.7 % — ABNORMAL LOW (ref 34.0–46.6)
Hemoglobin: 11.1 g/dL (ref 11.1–15.9)
Hepatitis B Surface Ag: NEGATIVE
Immature Grans (Abs): 0.1 10*3/uL (ref 0.0–0.1)
Immature Granulocytes: 1 %
Lymphocytes Absolute: 2 10*3/uL (ref 0.7–3.1)
Lymphs: 21 %
MCH: 29.5 pg (ref 26.6–33.0)
MCHC: 33.9 g/dL (ref 31.5–35.7)
MCV: 87 fL (ref 79–97)
Monocytes Absolute: 0.5 10*3/uL (ref 0.1–0.9)
Monocytes: 5 %
Neutrophils Absolute: 7.1 10*3/uL — ABNORMAL HIGH (ref 1.4–7.0)
Neutrophils: 73 %
Platelets: 330 10*3/uL (ref 150–450)
RBC: 3.76 x10E6/uL — ABNORMAL LOW (ref 3.77–5.28)
RDW: 13.1 % (ref 11.7–15.4)
RPR Ser Ql: NONREACTIVE
Rh Factor: POSITIVE
Rubella Antibodies, IGG: 2.41 index (ref >0.99)
WBC: 9.8 10*3/uL (ref 3.4–10.8)

## 2019-09-10 LAB — HEMOGLOBIN A1C
Est. average glucose Bld gHb Est-mCnc: 97 mg/dL
Hgb A1c MFr Bld: 5 % (ref 4.8–5.6)

## 2019-09-10 LAB — HEMOGLOBINOPATHY EVALUATION
Ferritin: 23 ng/mL (ref 15–150)
Hgb A2 Quant: 1.9 % (ref 1.8–3.2)
Hgb A: 98.1 % (ref 96.4–98.8)
Hgb C: 0 %
Hgb F Quant: 0 % (ref 0.0–2.0)
Hgb S: 0 %
Hgb Solubility: NEGATIVE
Hgb Variant: 0 %

## 2019-09-10 LAB — SMN1 COPY NUMBER ANALYSIS (SMA CARRIER SCREENING)

## 2019-09-16 ENCOUNTER — Ambulatory Visit (HOSPITAL_COMMUNITY): Payer: 59

## 2019-09-16 ENCOUNTER — Other Ambulatory Visit (HOSPITAL_COMMUNITY): Payer: 59

## 2019-09-17 ENCOUNTER — Ambulatory Visit (HOSPITAL_COMMUNITY)
Admission: RE | Admit: 2019-09-17 | Discharge: 2019-09-17 | Disposition: A | Discharge status: Home / Self Care | Payer: 59 | Source: Ambulatory Visit | Attending: Obstetrics and Gynecology | Admitting: Obstetrics and Gynecology

## 2019-09-17 ENCOUNTER — Other Ambulatory Visit: Payer: Self-pay

## 2019-09-17 ENCOUNTER — Encounter (HOSPITAL_COMMUNITY): Payer: Self-pay | Admitting: *Deleted

## 2019-09-17 ENCOUNTER — Ambulatory Visit (HOSPITAL_COMMUNITY): Payer: 59 | Admitting: *Deleted

## 2019-09-17 DIAGNOSIS — O0930 Supervision of pregnancy with insufficient antenatal care, unspecified trimester: Secondary | ICD-10-CM | POA: Insufficient documentation

## 2019-09-17 DIAGNOSIS — O0932 Supervision of pregnancy with insufficient antenatal care, second trimester: Secondary | ICD-10-CM | POA: Diagnosis not present

## 2019-09-17 DIAGNOSIS — O34219 Maternal care for unspecified type scar from previous cesarean delivery: Secondary | ICD-10-CM

## 2019-09-17 DIAGNOSIS — O99212 Obesity complicating pregnancy, second trimester: Secondary | ICD-10-CM | POA: Diagnosis not present

## 2019-09-17 DIAGNOSIS — Z98891 History of uterine scar from previous surgery: Secondary | ICD-10-CM | POA: Diagnosis present

## 2019-09-17 DIAGNOSIS — O09899 Supervision of other high risk pregnancies, unspecified trimester: Secondary | ICD-10-CM

## 2019-09-17 DIAGNOSIS — Z3A27 27 weeks gestation of pregnancy: Secondary | ICD-10-CM | POA: Diagnosis not present

## 2019-10-01 ENCOUNTER — Encounter: Payer: Self-pay | Admitting: Advanced Practice Midwife

## 2019-10-01 ENCOUNTER — Other Ambulatory Visit: Payer: Self-pay

## 2019-10-01 ENCOUNTER — Ambulatory Visit (INDEPENDENT_AMBULATORY_CARE_PROVIDER_SITE_OTHER): Payer: 59 | Admitting: Advanced Practice Midwife

## 2019-10-01 DIAGNOSIS — O09899 Supervision of other high risk pregnancies, unspecified trimester: Secondary | ICD-10-CM

## 2019-10-01 DIAGNOSIS — Z3A29 29 weeks gestation of pregnancy: Secondary | ICD-10-CM

## 2019-10-01 DIAGNOSIS — O34219 Maternal care for unspecified type scar from previous cesarean delivery: Secondary | ICD-10-CM

## 2019-10-01 NOTE — Patient Instructions (Signed)

## 2019-10-01 NOTE — Progress Notes (Signed)
   PRENATAL VISIT NOTE  Subjective:  Robin Conley is a 34 y.o. G5P4004 at [redacted]w[redacted]d being seen today for ongoing prenatal care.  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  Patient reports no complaints.  Contractions: Not present. Vag. Bleeding: None.  Movement: Present. Denies leaking of fluid.   The following portions of the patient's history were reviewed and updated as appropriate: allergies, current medications, past family history, past medical history, past social history, past surgical history and problem list.   Objective:   Vitals:   10/01/19 1057  BP: 102/61  Pulse: 90  Weight: 196 lb (88.9 kg)    Fetal Status: Fetal Heart Rate (bpm): 139   Movement: Present     General:  Alert, oriented and cooperative. Patient is in no acute distress.  Skin: Skin is warm and dry. No rash noted.   Cardiovascular: Normal heart rate noted  Respiratory: Normal respiratory effort, no problems with respiration noted  Abdomen: Soft, gravid, appropriate for gestational age.  Pain/Pressure: Present     Pelvic: Cervical exam deferred        Extremities: Normal range of motion.  Edema: Trace  Mental Status: Normal mood and affect. Normal behavior. Normal judgment and thought content.   Assessment and Plan:  Pregnancy: G5P4004 at 105w0d 1. Supervision of other high risk pregnancy, antepartum      No complaints "It's my 5th rodeo"      Reviewed location of new hospital and our staffing/workflow      Will do GTT this week  Preterm labor symptoms and general obstetric precautions including but not limited to vaginal bleeding, contractions, leaking of fluid and fetal movement were reviewed in detail with the patient. Please refer to After Visit Summary for other counseling recommendations.    Return in about 2 weeks (around 10/15/2019) for [facility].  Future Appointments  Date Time Provider Department Center  10/03/2019  8:30 AM [facility] NURSE [facility] None  10/29/2019  9:30 AM Aviva Signs, CNM [facility] None    Wynelle Bourgeois, CNM

## 2019-10-03 ENCOUNTER — Other Ambulatory Visit: Payer: Self-pay

## 2019-10-03 ENCOUNTER — Ambulatory Visit: Payer: 59

## 2019-10-03 DIAGNOSIS — O0930 Supervision of pregnancy with insufficient antenatal care, unspecified trimester: Secondary | ICD-10-CM

## 2019-10-03 DIAGNOSIS — O09899 Supervision of other high risk pregnancies, unspecified trimester: Secondary | ICD-10-CM

## 2019-10-03 NOTE — Progress Notes (Signed)
Patient sent to lab for 28 week labwork. Charm Stenner RN 

## 2019-10-04 LAB — CBC
Hematocrit: 31.8 % — ABNORMAL LOW (ref 34.0–46.6)
Hemoglobin: 10.7 g/dL — ABNORMAL LOW (ref 11.1–15.9)
MCH: 29.7 pg (ref 26.6–33.0)
MCHC: 33.6 g/dL (ref 31.5–35.7)
MCV: 88 fL (ref 79–97)
Platelets: 373 10*3/uL (ref 150–450)
RBC: 3.6 x10E6/uL — ABNORMAL LOW (ref 3.77–5.28)
RDW: 13.1 % (ref 11.7–15.4)
WBC: 9.9 10*3/uL (ref 3.4–10.8)

## 2019-10-04 LAB — HIV ANTIBODY (ROUTINE TESTING W REFLEX): HIV Screen 4th Generation wRfx: NONREACTIVE

## 2019-10-04 LAB — GLUCOSE TOLERANCE, 2 HOURS W/ 1HR
Glucose, 1 hour: 159 mg/dL (ref 65–179)
Glucose, 2 hour: 109 mg/dL (ref 65–152)
Glucose, Fasting: 95 mg/dL — ABNORMAL HIGH (ref 65–91)

## 2019-10-04 LAB — RPR: RPR Ser Ql: NONREACTIVE

## 2019-10-08 ENCOUNTER — Other Ambulatory Visit: Payer: Self-pay | Admitting: Family Medicine

## 2019-10-08 DIAGNOSIS — O24419 Gestational diabetes mellitus in pregnancy, unspecified control: Secondary | ICD-10-CM

## 2019-10-08 MED ORDER — ACCU-CHEK SOFTCLIX LANCETS MISC: 1.0000 | Freq: Four times a day (QID) | 12 refills | Status: DC

## 2019-10-08 MED ORDER — ACCU-CHEK SMARTVIEW VI STRP: ORAL_STRIP | 12 refills | Status: DC

## 2019-10-08 MED ORDER — ACCU-CHEK NANO SMARTVIEW W/DEVICE KIT: 1.0000 | PACK | 0 refills | Status: DC

## 2019-10-11 ENCOUNTER — Other Ambulatory Visit: Payer: Self-pay

## 2019-10-11 ENCOUNTER — Other Ambulatory Visit: Payer: Self-pay | Admitting: Lactation Services

## 2019-10-11 DIAGNOSIS — O24419 Gestational diabetes mellitus in pregnancy, unspecified control: Secondary | ICD-10-CM

## 2019-10-11 MED ORDER — ACCU-CHEK GUIDE W/DEVICE KIT: 1.0000 | PACK | Freq: Once | 0 refills | Status: AC

## 2019-10-11 MED ORDER — ACCU-CHEK GUIDE VI STRP: ORAL_STRIP | 12 refills | Status: DC

## 2019-10-11 NOTE — Progress Notes (Signed)
Blood Glucose supply orders changed to meter that is covered by patients insurance at Pharmacy request.

## 2019-10-15 ENCOUNTER — Other Ambulatory Visit: Payer: 59

## 2019-10-29 ENCOUNTER — Other Ambulatory Visit: Payer: Self-pay

## 2019-10-29 ENCOUNTER — Encounter: Payer: Self-pay | Admitting: Advanced Practice Midwife

## 2019-10-29 ENCOUNTER — Ambulatory Visit (INDEPENDENT_AMBULATORY_CARE_PROVIDER_SITE_OTHER): Payer: 59 | Admitting: Advanced Practice Midwife

## 2019-10-29 DIAGNOSIS — O24419 Gestational diabetes mellitus in pregnancy, unspecified control: Secondary | ICD-10-CM

## 2019-10-29 DIAGNOSIS — O09893 Supervision of other high risk pregnancies, third trimester: Secondary | ICD-10-CM

## 2019-10-29 DIAGNOSIS — O09899 Supervision of other high risk pregnancies, unspecified trimester: Secondary | ICD-10-CM

## 2019-10-29 DIAGNOSIS — O24415 Gestational diabetes mellitus in pregnancy, controlled by oral hypoglycemic drugs: Secondary | ICD-10-CM

## 2019-10-29 MED ORDER — METFORMIN HCL 500 MG PO TABS: 500.0000 mg | ORAL_TABLET | Freq: Every day | ORAL | Status: DC

## 2019-10-29 NOTE — Progress Notes (Signed)
   PRENATAL VISIT NOTE  Subjective:  Robin Conley is a 34 y.o. G5P4004 at [redacted]w[redacted]d being seen today for ongoing prenatal care.  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  Patient reports no complaints.  Contractions: Not present. Vag. Bleeding: None.  Movement: Present. Denies leaking of fluid.   States Fast[place] are often elevated.  One as high as 159 (after eating birthday cake night before) but many 120s.  Postprandial levels "all about 100".  States does not follow diet all the time  The following portions of the patient's history were reviewed and updated as appropriate: allergies, current medications, past family history, past medical history, past social history, past surgical history and problem list.   Objective:   Vitals:   10/29/19 0918  BP: 101/62  Pulse: 90  Weight: 197 lb (89.4 kg)    Fetal Status: Fetal Heart Rate (bpm): 140 Fundal Height: 33 cm Movement: Present     General:  Alert, oriented and cooperative. Patient is in no acute distress.  Skin: Skin is warm and dry. No rash noted.   Cardiovascular: Normal heart rate noted  Respiratory: Normal respiratory effort, no problems with respiration noted  Abdomen: Soft, gravid, appropriate for gestational age.  Pain/Pressure: Present     Pelvic: Cervical exam deferred        Extremities: Normal range of motion.  Edema: Trace  Mental Status: Normal mood and affect. Normal behavior. Normal judgment and thought content.   Assessment and Plan:  Pregnancy: G5P4004 at [redacted]w[redacted]d 1. Supervision of other high risk pregnancy, antepartum      Doing well  2. Gestational diabetes mellitus (GDM) in third trimester, gestational diabetes method of control unspecified  Will start her on Metformin 500mg  qHS for FBS control  Preterm labor symptoms and general obstetric precautions including but not limited to vaginal bleeding, contractions, leaking of fluid and fetal movement were reviewed in detail with the patient. Please refer to After Visit Summary for other counseling recommendations.   Return in about 2 weeks (around 11/12/2019).  Future Appointments  Date Time Provider Department Center  11/14/2019 10:00 AM 01/14/2020, MD [facility] None    Willodean Rosenthal, CNM

## 2019-10-29 NOTE — Patient Instructions (Signed)
Gestational Diabetes Mellitus, Self Care When you have gestational diabetes (gestational diabetes mellitus), you must make sure your blood sugar (glucose) stays in a healthy range. You can do this with:  Nutrition.  Exercise.  Lifestyle changes.  Medicines or insulin, if needed.  Support from your doctors and others. If you get treated for this condition, it may not hurt you or your unborn baby (fetus). If you do not get treated for this condition, it may cause problems that can hurt you or your unborn baby. If you get gestational diabetes, you are:  More likely to get it if you get pregnant again.  More likely to develop type 2 diabetes in the future. How to stay aware of blood sugar   Check your blood sugar every day while you are pregnant. Check it as often as told.  Call your doctor if your blood sugar is above your goal numbers for two tests in a row. Your doctor will set personal treatment goals for you. Generally, you should have these blood sugar levels:  Before meals, or after not eating for a long time (fasting or preprandial): at or below 95 mg/dL (5.3 mmol/L).  After meals (postprandial): ? One hour after a meal: at or below 140 mg/dL (7.8 mmol/L). ? Two hours after a meal: at or below 120 mg/dL (6.7 mmol/L).  A1c (hemoglobin A1c) level: 6-6.5%. How to manage high and low blood sugar Signs of high blood sugar High blood sugar is called hyperglycemia. Know the early signs of high blood sugar. Signs may include:  Feeling: ? Thirsty. ? Hungry. ? Very tired.  Needing to pee (urinate) more than usual.  Blurry vision. Signs of low blood sugar Low blood sugar is called hypoglycemia. This is when blood sugar is at or below 70 mg/dL (3.9 mmol/L). Signs may include:  Feeling: ? Hungry. ? Worried or nervous (anxious). ? Sweaty and clammy. ? Confused. ? Dizzy. ? Sleepy. ? Sick to your stomach (nauseous).  Having: ? A fast heartbeat. ? A headache. ? A change  in your vision. ? Tingling or no feeling (numbness) around your mouth, lips, or tongue. ? Jerky movements that you cannot control (seizure).  Having trouble with: ? Moving (coordination). ? Sleeping. ? Passing out (fainting). ? Getting upset easily (irritability). Treating low blood sugar To treat low blood sugar, eat or drink something sugary right away. If you can think clearly and swallow safely, follow the 15:15 rule:  Take 15 grams of a fast-acting carb (carbohydrate). Talk with your doctor about how much you should take.  Some fast-acting carbs are: ? Sugar tablets (glucose pills). Take 3-4 glucose pills. ? 6-8 pieces of hard candy. ? 4-6 oz (120-150 mL) of fruit juice. ? 4-6 oz (120-150 mL) of regular (not diet) soda. ? 1 Tbsp (15 mL) honey or sugar.  Check your blood sugar 15 minutes after you take the carb.  If your blood sugar is still at or below 70 mg/dL (3.9 mmol/L), take 15 grams of a carb again.  If your blood sugar does not go above 70 mg/dL (3.9 mmol/L) after 3 tries, get help right away.  After your blood sugar goes back to normal, eat a meal or a snack within 1 hour. Treating very low blood sugar If your blood sugar is at or below 54 mg/dL (3 mmol/L), you have very low blood sugar (severe hypoglycemia). This is an emergency. Do not wait to see if the symptoms will go away. Get medical help right  away. Call your local emergency services (911 in the [place] If you have very low blood sugar and you cannot eat or drink, you may need a glucagon shot (injection). A family member or friend should learn how to check your blood sugar and how to give you a glucagon shot. Ask your doctor if you need to have a glucagon shot kit at home. Follow these instructions at home: Medicine  Take your insulin and diabetes medicines as told.  If your doctor says you should take more or less insulin or medicines, do this exactly as told.  Do not run out of insulin or  medicines. Food   Make healthy food choices. These include: ? Chicken, fish, egg whites, and beans. ? Oats, whole wheat, bulgur, brown rice, quinoa, and millet. ? Fresh fruits and vegetables. ? Low-fat dairy products. ? Nuts, avocado, olive oil, and canola oil.  Meet with a food specialist (dietitian). He or she can help you make an eating plan that is right for you.  Follow instructions from your doctor about what you cannot eat or drink.  Drink enough fluid to keep your pee (urine) pale yellow.  Eat healthy snacks between healthy meals.  Keep track of carbs that you eat. Do this by reading food labels and learning food serving sizes.  Follow your sick day plan when you cannot eat or drink normally. Make this plan with your doctor so it is ready to use. Activity  Exercise for 30 or more minutes a day, or as much as your doctor recommends.  Talk with your doctor before you start a new exercise or activity. Your doctor may need to tell you to change: ? How much insulin or medicines you take. ? How much food you eat. Lifestyle  Do not drink alcohol.  Do not use any tobacco products. These include cigarettes, chewing tobacco, and e-cigarettes. If you need help quitting, ask your doctor.  Learn how to deal with stress. If you need help with this, ask your doctor. Body care  Stay up to date with your shots (immunizations).  Brush your teeth and gums two times a day. Floss one or more times a day.  Go to the dentist one or more times every 6 months.  Stay at a healthy weight while you are pregnant. General instructions  Take over-the-counter and prescription medicines only as told by your doctor.  Ask your doctor about risks of high blood pressure in pregnancy (preeclampsia and eclampsia).  Share your diabetes care plan with: ? Your work or school. ? People you live with.  Check your pee for ketones: ? When you are sick. ? As told by your doctor.  Carry a card or  wear jewelry that says you have diabetes.  Keep all follow-up visits as told by your doctor. This is important. Care after giving birth  Have your blood sugar checked 4-12 weeks after you give birth.  Get checked for diabetes one or more times during 3 years. Questions to ask your doctor  Do I need to meet with a diabetes educator?  Where can I find a support group for people with gestational diabetes? Where to find more information To learn more about diabetes, visit:  American Diabetes Association: www.diabetes.org  Centers for Disease Control and Prevention (CDC): www.cdc.gov Summary  Check your blood sugar (glucose) every day while you are pregnant. Check it as often as told.  Take your insulin and diabetes medicines as told.  Keep all follow-up visits as   told by your doctor. This is important.  Have your blood sugar checked 4-12 weeks after you give birth. This information is not intended to replace advice given to you by your health care provider. Make sure you discuss any questions you have with your health care provider. Document Revised: 01/15/2018 Document Reviewed: 08/28/2015 Elsevier Patient Education  2020 Elsevier Inc.  

## 2019-11-14 ENCOUNTER — Ambulatory Visit (INDEPENDENT_AMBULATORY_CARE_PROVIDER_SITE_OTHER): Payer: 59 | Admitting: Obstetrics & Gynecology

## 2019-11-14 ENCOUNTER — Other Ambulatory Visit: Payer: Self-pay

## 2019-11-14 VITALS — BP 105/61 | HR 85 | Wt 197.0 lb

## 2019-11-14 DIAGNOSIS — O09899 Supervision of other high risk pregnancies, unspecified trimester: Secondary | ICD-10-CM

## 2019-11-14 DIAGNOSIS — O24410 Gestational diabetes mellitus in pregnancy, diet controlled: Secondary | ICD-10-CM

## 2019-11-14 DIAGNOSIS — Z98891 History of uterine scar from previous surgery: Secondary | ICD-10-CM

## 2019-11-14 DIAGNOSIS — Z3A35 35 weeks gestation of pregnancy: Secondary | ICD-10-CM

## 2019-11-14 DIAGNOSIS — O3429 Maternal care due to uterine scar from other previous surgery: Secondary | ICD-10-CM

## 2019-11-14 NOTE — Progress Notes (Signed)
   PRENATAL VISIT NOTE  Subjective:  Robin Conley is a 34 y.o. G5P4004 at [redacted]w[redacted]d being seen today for ongoing prenatal care.  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  Patient reports no complaints.  Contractions: Not present. Vag. Bleeding: None.  Movement: Present. Denies leaking of fluid.   The following portions of the patient's history were reviewed and updated as appropriate: allergies, current medications, past family history, past medical history, past social history, past surgical history and problem list.   Objective:   Vitals:   11/14/19 0948  BP: 105/61  Pulse: 85  Weight: 197 lb (89.4 kg)    Fetal Status: Fetal Heart Rate (bpm): 151   Movement: Present     General:  Alert, oriented and cooperative. Patient is in no acute distress.  Skin: Skin is warm and dry. No rash noted.   Cardiovascular: Normal heart rate noted  Respiratory: Normal respiratory effort, no problems with respiration noted  Abdomen: Soft, gravid, appropriate for gestational age.  Pain/Pressure: Absent     Pelvic: Cervical exam deferred        Extremities: Normal range of motion.  Edema: Trace  Mental Status: Normal mood and affect. Normal behavior. Normal judgment and thought content.   Assessment and Plan:  Pregnancy: G5P4004 at [redacted]w[redacted]d 1. Supervision of other high risk pregnancy, antepartum FH and FHR are WNL  2. Diet controlled gestational diabetes mellitus (GDM) in third trimester Pt did not have log or meter.  Reports fasting of ~96 as the highest.  2 hour PP ~120s Pt encouraged to bring log next visit.   3. 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  Pt will think about it. Does not want more children.       4. History of cesarean delivery For repeat at 39 weeks. Scheduled.     Preterm labor symptoms and general obstetric precautions including but not limited to vaginal bleeding, contractions, leaking of fluid and fetal movement were reviewed in detail with the patient. Please refer to After Visit Summary for other counseling recommendations.   Return in about 1 week (around 11/21/2019) for in person.  No future appointments.  Willodean Rosenthal, MD

## 2019-11-20 NOTE — Addendum Note (Signed)
Addended by: Jaynie Collins A on: 11/20/2019 12:11 PM   Modules accepted: Orders, SmartSet

## 2019-11-21 ENCOUNTER — Ambulatory Visit (INDEPENDENT_AMBULATORY_CARE_PROVIDER_SITE_OTHER): Payer: Medicaid Other | Admitting: Family Medicine

## 2019-11-21 ENCOUNTER — Other Ambulatory Visit (HOSPITAL_COMMUNITY)
Admission: RE | Admit: 2019-11-21 | Discharge: 2019-11-21 | Disposition: A | Discharge status: Home / Self Care | Payer: 59 | Source: Ambulatory Visit | Attending: Family Medicine | Admitting: Family Medicine

## 2019-11-21 ENCOUNTER — Other Ambulatory Visit: Payer: Self-pay

## 2019-11-21 VITALS — BP 101/61 | HR 84 | Wt 199.0 lb

## 2019-11-21 DIAGNOSIS — O34219 Maternal care for unspecified type scar from previous cesarean delivery: Secondary | ICD-10-CM

## 2019-11-21 DIAGNOSIS — Z98891 History of uterine scar from previous surgery: Secondary | ICD-10-CM

## 2019-11-21 DIAGNOSIS — O09899 Supervision of other high risk pregnancies, unspecified trimester: Secondary | ICD-10-CM | POA: Diagnosis present

## 2019-11-21 DIAGNOSIS — O24415 Gestational diabetes mellitus in pregnancy, controlled by oral hypoglycemic drugs: Secondary | ICD-10-CM

## 2019-11-21 DIAGNOSIS — Z3A36 36 weeks gestation of pregnancy: Secondary | ICD-10-CM

## 2019-11-21 MED ORDER — FREESTYLE LIBRE 2 SENSOR MISC: 1.0000 [IU] | 1 refills | Status: DC

## 2019-11-21 MED ORDER — FREESTYLE LIBRE 2 READER DEVI: 1.0000 [IU] | Freq: Four times a day (QID) | 0 refills | Status: DC

## 2019-11-21 NOTE — Progress Notes (Signed)
Subjective:  Robin Conley is a 34 y.o. G5P4004 at [redacted]w[redacted]d being seen today for ongoing prenatal care.  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  GDM: Patient taking metformin.  Reports no hypoglycemic episodes.  Tolerating medication well. She is not testing often - only had five checks in the past week - some of these are borderline.  Patient reports no complaints.  Contractions: Not present. Vag. Bleeding: None.  Movement: Present. Denies leaking of fluid.   The following portions of the patient's history were reviewed and updated as appropriate: allergies, current medications, past family history, past medical history, past social history, past surgical history and problem list. Problem list updated.  Objective:   Vitals:   11/21/19 1009  BP: 101/61  Pulse: 84  Weight: 199 lb (90.3 kg)    Fetal Status: Fetal Heart Rate (bpm): 148   Movement: Present     General:  Alert, oriented and cooperative. Patient is in no acute distress.  Skin: Skin is warm and dry. No rash noted.   Cardiovascular: Normal heart rate noted  Respiratory: Normal respiratory effort, no problems with respiration noted  Abdomen: Soft, gravid, appropriate for gestational age. Pain/Pressure: Present     Pelvic: Vag. Bleeding: None     Cervical exam deferred        Extremities: Normal range of motion.  Edema: Trace  Mental Status: Normal mood and affect. Normal behavior. Normal judgment and thought content.   Urinalysis:      Assessment and Plan:  Pregnancy: G5P4004 at [redacted]w[redacted]d  1. Supervision of other high risk pregnancy, antepartum FHT and FH normal - Cervicovaginal ancillary only( [place]) - Culture, beta strep (group b only)  2.  Gestational diabetes mellitus (GDM) in third trimester controlled on oral hypoglycemic drug Will prescribe CGM to see if this helps with compliance. Discussed how to use.    3. History of cesarean delivery Scheduled for repeat   Preterm labor symptoms and general obstetric precautions including but not limited to vaginal bleeding, contractions, leaking of fluid and fetal movement were reviewed in detail with the patient. Please refer to After Visit Summary for other counseling recommendations.  No follow-ups on file.   Levie Heritage, DO

## 2019-11-22 LAB — CERVICOVAGINAL ANCILLARY ONLY
Chlamydia: NEGATIVE
Comment: NEGATIVE
Comment: NORMAL
Neisseria Gonorrhea: NEGATIVE

## 2019-11-25 LAB — CULTURE, BETA STREP (GROUP B ONLY): Strep Gp B Culture: NEGATIVE

## 2019-11-26 ENCOUNTER — Encounter: Payer: Self-pay | Admitting: Advanced Practice Midwife

## 2019-11-26 ENCOUNTER — Other Ambulatory Visit: Payer: Self-pay

## 2019-11-26 ENCOUNTER — Ambulatory Visit (INDEPENDENT_AMBULATORY_CARE_PROVIDER_SITE_OTHER): Payer: Medicaid Other | Admitting: Advanced Practice Midwife

## 2019-11-26 VITALS — BP 104/64 | HR 90 | Wt 198.0 lb

## 2019-11-26 DIAGNOSIS — O24415 Gestational diabetes mellitus in pregnancy, controlled by oral hypoglycemic drugs: Secondary | ICD-10-CM

## 2019-11-26 DIAGNOSIS — Z3A37 37 weeks gestation of pregnancy: Secondary | ICD-10-CM

## 2019-11-26 DIAGNOSIS — O09899 Supervision of other high risk pregnancies, unspecified trimester: Secondary | ICD-10-CM

## 2019-11-26 NOTE — Progress Notes (Signed)
   PRENATAL VISIT NOTE  Subjective:  Robin Conley is a 34 y.o. G5P4004 at [redacted]w[redacted]d being seen today for ongoing prenatal care.  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  Patient reports no complaints.  Contractions: Irregular. Vag. Bleeding: None.  Movement: Present. Denies leaking of fluid.   The following portions of the patient's history were reviewed and updated as appropriate: allergies, current medications, past family history, past medical history, past social history, past surgical history and problem list.   Objective:   Vitals:   11/26/19 1050  BP: 104/64  Pulse: 90  Weight: 198 lb (89.8 kg)    Fetal Status: Fetal Heart Rate (bpm): 135   Movement: Present     General:  Alert, oriented and cooperative. Patient is in no acute distress.  Skin: Skin is warm and dry. No rash noted.   Cardiovascular: Normal heart rate noted  Respiratory: Normal respiratory effort, no problems with respiration noted  Abdomen: Soft, gravid, appropriate for gestational age.  Pain/Pressure: Present     Pelvic: Cervical exam deferred        Extremities: Normal range of motion.  Edema: Trace  Mental Status: Normal mood and affect. Normal behavior. Normal judgment and thought content.   Assessment and Plan:  Pregnancy: G5P4004 at [redacted]w[redacted]d 1. Gestational diabetes mellitus (GDM) in third trimester controlled on oral hypoglycemic drug     Doing well with CGM, her son helped her put it on     Has limits set 70-120     States FBSs are all under 90, some low in 70s, supplements snack     PP levels under 120  2.  Supervision High Risk pregnancy       Doing well, good FM  3.   Hx C/S       Plans repeat  Term labor symptoms and general  obstetric precautions including but not limited to vaginal bleeding, contractions, leaking of fluid and fetal movement were reviewed in detail with the patient. Please refer to After Visit Summary for other counseling recommendations.   Return in about 1 week (around 12/03/2019) for [facility].  Future Appointments  Date Time Provider Department Center  12/05/2019 10:15 AM Levie Heritage, DO [facility] None  12/19/2019  1:45 PM Levie Heritage, DO [facility] None  01/16/2020  1:30 PM Levie Heritage, DO [facility] None    Wynelle Bourgeois, CNM

## 2019-11-26 NOTE — Patient Instructions (Signed)
Third Trimester of Pregnancy The third trimester is from week 28 through week 40 (months 7 through 9). The third trimester is a time when the unborn baby (fetus) is growing rapidly. At the end of the ninth month, the fetus is about 20 inches in length and weighs 6-10 pounds. Body changes during your third trimester Your body will continue to go through many changes during pregnancy. The changes vary from woman to woman. During the third trimester:  Your weight will continue to increase. You can expect to gain 25-35 pounds (11-16 kg) by the end of the pregnancy.  You may begin to get stretch marks on your hips, abdomen, and breasts.  You may urinate more often because the fetus is moving lower into your pelvis and pressing on your bladder.  You may develop or continue to have heartburn. This is caused by increased hormones that slow down muscles in the digestive tract.  You may develop or continue to have constipation because increased hormones slow digestion and cause the muscles that push waste through your intestines to relax.  You may develop hemorrhoids. These are swollen veins (varicose veins) in the rectum that can itch or be painful.  You may develop swollen, bulging veins (varicose veins) in your legs.  You may have increased body aches in the pelvis, back, or thighs. This is due to weight gain and increased hormones that are relaxing your joints.  You may have changes in your hair. These can include thickening of your hair, rapid growth, and changes in texture. Some women also have hair loss during or after pregnancy, or hair that feels dry or thin. Your hair will most likely return to normal after your baby is born.  Your breasts will continue to grow and they will continue to become tender. A yellow fluid (colostrum) may leak from your breasts. This is the first milk you are producing for your baby.  Your belly button may stick out.  You may notice more swelling in your hands,  face, or ankles.  You may have increased tingling or numbness in your hands, arms, and legs. The skin on your belly may also feel numb.  You may feel short of breath because of your expanding uterus.  You may have more problems sleeping. This can be caused by the size of your belly, increased need to urinate, and an increase in your body's metabolism.  You may notice the fetus "dropping," or moving lower in your abdomen (lightening).  You may have increased vaginal discharge.  You may notice your joints feel loose and you may have pain around your pelvic bone. What to expect at prenatal visits You will have prenatal exams every 2 weeks until week 36. Then you will have weekly prenatal exams. During a routine prenatal visit:  You will be weighed to make sure you and the baby are growing normally.  Your blood pressure will be taken.  Your abdomen will be measured to track your baby's growth.  The fetal heartbeat will be listened to.  Any test results from the previous visit will be discussed.  You may have a cervical check near your due date to see if your cervix has softened or thinned (effaced).  You will be tested for Group B streptococcus. This happens between 35 and 37 weeks. Your health care provider may ask you:  What your birth plan is.  How you are feeling.  If you are feeling the baby move.  If you have had any abnormal   symptoms, such as leaking fluid, bleeding, severe headaches, or abdominal cramping.  If you are using any tobacco products, including cigarettes, chewing tobacco, and electronic cigarettes.  If you have any questions. Other tests or screenings that may be performed during your third trimester include:  Blood tests that check for low iron levels (anemia).  Fetal testing to check the health, activity level, and growth of the fetus. Testing is done if you have certain medical conditions or if there are problems during the pregnancy.  Nonstress test  (NST). This test checks the health of your baby to make sure there are no signs of problems, such as the baby not getting enough oxygen. During this test, a belt is placed around your belly. The baby is made to move, and its heart rate is monitored during movement. What is false labor? False labor is a condition in which you feel small, irregular tightenings of the muscles in the womb (contractions) that usually go away with rest, changing position, or drinking water. These are called Braxton Hicks contractions. Contractions may last for hours, days, or even weeks before true labor sets in. If contractions come at regular intervals, become more frequent, increase in intensity, or become painful, you should see your health care provider. What are the signs of labor?  Abdominal cramps.  Regular contractions that start at 10 minutes apart and become stronger and more frequent with time.  Contractions that start on the top of the uterus and spread down to the lower abdomen and back.  Increased pelvic pressure and dull back pain.  A watery or bloody mucus discharge that comes from the vagina.  Leaking of amniotic fluid. This is also known as your "water breaking." It could be a slow trickle or a gush. Let your health care provider know if it has a color or strange odor. If you have any of these signs, call your health care provider right away, even if it is before your due date. Follow these instructions at home: Medicines  Follow your health care provider's instructions regarding medicine use. Specific medicines may be either safe or unsafe to take during pregnancy.  Take a prenatal vitamin that contains at least 600 micrograms (mcg) of folic acid.  If you develop constipation, try taking a stool softener if your health care provider approves. Eating and drinking   Eat a balanced diet that includes fresh fruits and vegetables, whole grains, good sources of protein such as meat, eggs, or tofu,  and low-fat dairy. Your health care provider will help you determine the amount of weight gain that is right for you.  Avoid raw meat and uncooked cheese. These carry germs that can cause birth defects in the baby.  If you have low calcium intake from food, talk to your health care provider about whether you should take a daily calcium supplement.  Eat four or five small meals rather than three large meals a day.  Limit foods that are high in fat and processed sugars, such as fried and sweet foods.  To prevent constipation: ? Drink enough fluid to keep your urine clear or pale yellow. ? Eat foods that are high in fiber, such as fresh fruits and vegetables, whole grains, and beans. Activity  Exercise only as directed by your health care provider. Most women can continue their usual exercise routine during pregnancy. Try to exercise for 30 minutes at least 5 days a week. Stop exercising if you experience uterine contractions.  Avoid heavy lifting.  Do   not exercise in extreme heat or humidity, or at high altitudes.  Wear low-heel, comfortable shoes.  Practice good posture.  You may continue to have sex unless your health care provider tells you otherwise. Relieving pain and discomfort  Take frequent breaks and rest with your legs elevated if you have leg cramps or low back pain.  Take warm sitz baths to soothe any pain or discomfort caused by hemorrhoids. Use hemorrhoid cream if your health care provider approves.  Wear a good support bra to prevent discomfort from breast tenderness.  If you develop varicose veins: ? Wear support pantyhose or compression stockings as told by your healthcare provider. ? Elevate your feet for 15 minutes, 3-4 times a day. Prenatal care  Write down your questions. Take them to your prenatal visits.  Keep all your prenatal visits as told by your health care provider. This is important. Safety  Wear your seat belt at all times when driving.  Make  a list of emergency phone numbers, including numbers for family, friends, the hospital, and police and fire departments. General instructions  Avoid cat litter boxes and soil used by cats. These carry germs that can cause birth defects in the baby. If you have a cat, ask someone to clean the litter box for you.  Do not travel far distances unless it is absolutely necessary and only with the approval of your health care provider.  Do not use hot tubs, steam rooms, or saunas.  Do not drink alcohol.  Do not use any products that contain nicotine or tobacco, such as cigarettes and e-cigarettes. If you need help quitting, ask your health care provider.  Do not use any medicinal herbs or unprescribed drugs. These chemicals affect the formation and growth of the baby.  Do not douche or use tampons or scented sanitary pads.  Do not cross your legs for long periods of time.  To prepare for the arrival of your baby: ? Take prenatal classes to understand, practice, and ask questions about labor and delivery. ? Make a trial run to the hospital. ? Visit the hospital and tour the maternity area. ? Arrange for maternity or paternity leave through employers. ? Arrange for family and friends to take care of pets while you are in the hospital. ? Purchase a rear-facing car seat and make sure you know how to install it in your car. ? Pack your hospital bag. ? Prepare the baby's nursery. Make sure to remove all pillows and stuffed animals from the baby's crib to prevent suffocation.  Visit your dentist if you have not gone during your pregnancy. Use a soft toothbrush to brush your teeth and be gentle when you floss. Contact a health care provider if:  You are unsure if you are in labor or if your water has broken.  You become dizzy.  You have mild pelvic cramps, pelvic pressure, or nagging pain in your abdominal area.  You have lower back pain.  You have persistent nausea, vomiting, or  diarrhea.  You have an unusual or bad smelling vaginal discharge.  You have pain when you urinate. Get help right away if:  Your water breaks before 37 weeks.  You have regular contractions less than 5 minutes apart before 37 weeks.  You have a fever.  You are leaking fluid from your vagina.  You have spotting or bleeding from your vagina.  You have severe abdominal pain or cramping.  You have rapid weight loss or weight gain.  You have   shortness of breath with chest pain.  You notice sudden or extreme swelling of your face, hands, ankles, feet, or legs.  Your baby makes fewer than 10 movements in 2 hours.  You have severe headaches that do not go away when you take medicine.  You have vision changes. Summary  The third trimester is from week 28 through week 40, months 7 through 9. The third trimester is a time when the unborn baby (fetus) is growing rapidly.  During the third trimester, your discomfort may increase as you and your baby continue to gain weight. You may have abdominal, leg, and back pain, sleeping problems, and an increased need to urinate.  During the third trimester your breasts will keep growing and they will continue to become tender. A yellow fluid (colostrum) may leak from your breasts. This is the first milk you are producing for your baby.  False labor is a condition in which you feel small, irregular tightenings of the muscles in the womb (contractions) that eventually go away. These are called Braxton Hicks contractions. Contractions may last for hours, days, or even weeks before true labor sets in.  Signs of labor can include: abdominal cramps; regular contractions that start at 10 minutes apart and become stronger and more frequent with time; watery or bloody mucus discharge that comes from the vagina; increased pelvic pressure and dull back pain; and leaking of amniotic fluid. This information is not intended to replace advice given to you by your  health care provider. Make sure you discuss any questions you have with your health care provider. Document Revised: 11/15/2018 Document Reviewed: 08/30/2016 Elsevier Patient Education  2020 [employer]  

## 2019-11-29 NOTE — Patient Instructions (Signed)
Robin Conley  11/29/2019   Your procedure is scheduled on:  12/10/2019  Arrive at 1030 at Entrance C on [address] at [facility]  and [employer]. You are invited to use the FREE valet parking or use the Visitor's parking deck.  Pick up the phone at the desk and dial [phone].  Call this number if you have problems the morning of surgery: [phone]  Remember:   Do not eat food:(After Midnight) Desps de medianoche.  Do not drink clear liquids: (After Midnight) Desps de medianoche.  Take these medicines the morning of surgery with A SIP OF WATER:  Do not take your metformin the night before your surgery   Do not wear jewelry, make-up or nail polish.  Do not wear lotions, powders, or perfumes. Do not wear deodorant.  Do not shave 48 hours prior to surgery.  Do not bring valuables to the hospital.  [facility] is not   responsible for any belongings or valuables brought to the hospital.  Contacts, dentures or bridgework may not be worn into surgery.  Leave suitcase in the car. After surgery it may be brought to your room.  For patients admitted to the hospital, checkout time is 11:00 AM the day of              discharge.      Please read over the following fact sheets that you were given:     Preparing for Surgery

## 2019-12-02 ENCOUNTER — Encounter (HOSPITAL_COMMUNITY): Payer: Self-pay

## 2019-12-02 ENCOUNTER — Telehealth (HOSPITAL_COMMUNITY): Payer: Self-pay | Admitting: *Deleted

## 2019-12-02 NOTE — Telephone Encounter (Signed)
Preadmission screen  

## 2019-12-05 ENCOUNTER — Ambulatory Visit (INDEPENDENT_AMBULATORY_CARE_PROVIDER_SITE_OTHER): Payer: 59 | Admitting: Family Medicine

## 2019-12-05 ENCOUNTER — Other Ambulatory Visit: Payer: Self-pay

## 2019-12-05 VITALS — BP 94/60 | HR 91 | Wt 202.0 lb

## 2019-12-05 DIAGNOSIS — Z98891 History of uterine scar from previous surgery: Secondary | ICD-10-CM

## 2019-12-05 DIAGNOSIS — O24415 Gestational diabetes mellitus in pregnancy, controlled by oral hypoglycemic drugs: Secondary | ICD-10-CM

## 2019-12-05 DIAGNOSIS — O09899 Supervision of other high risk pregnancies, unspecified trimester: Secondary | ICD-10-CM

## 2019-12-05 DIAGNOSIS — Z3A38 38 weeks gestation of pregnancy: Secondary | ICD-10-CM

## 2019-12-05 DIAGNOSIS — O3429 Maternal care due to uterine scar from other previous surgery: Secondary | ICD-10-CM

## 2019-12-05 MED ORDER — METFORMIN HCL 500 MG PO TABS: 500.0000 mg | ORAL_TABLET | Freq: Every day | ORAL | 0 refills | Status: DC

## 2019-12-05 MED ORDER — FREESTYLE LIBRE 2 SENSOR MISC: 1.0000 [IU] | 1 refills | Status: DC

## 2019-12-05 NOTE — Progress Notes (Signed)
   PRENATAL VISIT NOTE  Subjective:  Robin Conley is a 34 y.o. G5P4004 at [redacted]w[redacted]d being seen today for ongoing prenatal care.  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  Patient reports no complaints.  Contractions: Irregular. Vag. Bleeding: None.  Movement: Present. Denies leaking of fluid.   The following portions of the patient's history were reviewed and updated as appropriate: allergies, current medications, past family history, past medical history, past social history, past surgical history and problem list.   Objective:   Vitals:   12/05/19 1012  BP: 94/60  Pulse: 91  Weight: 202 lb (91.6 kg)    Fetal Status: Fetal Heart Rate (bpm): 150   Movement: Present     General:  Alert, oriented and cooperative. Patient is in no acute distress.  Skin: Skin is warm and dry. No rash noted.   Cardiovascular: Normal heart rate noted  Respiratory: Normal respiratory effort, no problems with respiration noted  Abdomen: Soft, gravid, appropriate for gestational age.  Pain/Pressure: Present     Pelvic: Cervical exam deferred        Extremities: Normal range of motion.  Edema: Trace  Mental Status: Normal mood and affect. Normal behavior. Normal judgment and thought content.   Assessment and Plan:  Pregnancy: G5P4004 at [redacted]w[redacted]d 1. Supervision of other high risk pregnancy, antepartum FHT and FH normal  2. History of cesarean delivery C/s at 39 weeks  3. Gestational diabetes mellitus (GDM) in third trimester controlled on oral hypoglycemic drug CGM data reviewed.  Average PP 125-130. Will start metformin in AM. NST today Delivery at 39 weeks.  Term labor symptoms and general obstetric precautions including but not limited  to vaginal bleeding, contractions, leaking of fluid and fetal movement were reviewed in detail with the patient. Please refer to After Visit Summary for other counseling recommendations.   No follow-ups on file.  Future Appointments  Date Time Provider Department Center  12/08/2019  8:20 AM [facility] None  12/19/2019  1:45 PM Levie Heritage, DO [facility] None  01/16/2020  1:30 PM Levie Heritage, DO [facility] None    Levie Heritage, DO

## 2019-12-06 ENCOUNTER — Other Ambulatory Visit: Payer: Self-pay | Admitting: Family Medicine

## 2019-12-08 ENCOUNTER — Other Ambulatory Visit (HOSPITAL_COMMUNITY)
Admission: RE | Admit: 2019-12-08 | Discharge: 2019-12-08 | Disposition: A | Discharge status: Home / Self Care | Payer: 59 | Source: Ambulatory Visit | Attending: Family Medicine | Admitting: Family Medicine

## 2019-12-08 ENCOUNTER — Other Ambulatory Visit: Payer: Self-pay

## 2019-12-08 DIAGNOSIS — Z20822 Contact with and (suspected) exposure to covid-19: Secondary | ICD-10-CM | POA: Insufficient documentation

## 2019-12-08 HISTORY — DX: Gestational diabetes mellitus in pregnancy, unspecified control: O24.419

## 2019-12-08 LAB — BASIC METABOLIC PANEL
Anion gap: 9 (ref 5–15)
BUN: 10 mg/dL (ref 6–20)
CO2: 23 mmol/L (ref 22–32)
Calcium: 9.1 mg/dL (ref 8.9–10.3)
Chloride: 104 mmol/L (ref 98–111)
Creatinine, Ser: 0.56 mg/dL (ref 0.44–1.00)
GFR calc Af Amer: >60 mL/min (ref >60)
GFR calc non Af Amer: >60 mL/min (ref >60)
Glucose, Bld: 91 mg/dL (ref 70–99)
Potassium: 3.6 mmol/L (ref 3.5–5.1)
Sodium: 136 mmol/L (ref 135–145)

## 2019-12-08 LAB — CBC
HCT: 31.5 % — ABNORMAL LOW (ref 36.0–46.0)
Hemoglobin: 10.3 g/dL — ABNORMAL LOW (ref 12.0–15.0)
MCH: 28.6 pg (ref 26.0–34.0)
MCHC: 32.7 g/dL (ref 30.0–36.0)
MCV: 87.5 fL (ref 80.0–100.0)
Platelets: 491 10*3/uL — ABNORMAL HIGH (ref 150–400)
RBC: 3.6 MIL/uL — ABNORMAL LOW (ref 3.87–5.11)
RDW: 14.4 % (ref 11.5–15.5)
WBC: 11.6 10*3/uL — ABNORMAL HIGH (ref 4.0–10.5)
nRBC: 0 % (ref 0.0–0.2)

## 2019-12-08 LAB — ABO/RH: ABO/RH(D): O POS

## 2019-12-08 LAB — SARS CORONAVIRUS 2 (TAT 6-24 HRS): SARS Coronavirus 2: NEGATIVE

## 2019-12-08 NOTE — MAU Note (Signed)
Pt here for covid swab and lab draw. Denies symptoms or sick contacts. Swab collected.  

## 2019-12-09 ENCOUNTER — Encounter (HOSPITAL_COMMUNITY): Payer: Self-pay | Admitting: Obstetrics & Gynecology

## 2019-12-09 LAB — RPR: RPR Ser Ql: NONREACTIVE

## 2019-12-10 ENCOUNTER — Encounter (HOSPITAL_COMMUNITY): Payer: Self-pay | Admitting: Obstetrics & Gynecology

## 2019-12-10 ENCOUNTER — Inpatient Hospital Stay (HOSPITAL_COMMUNITY): Payer: 59 | Admitting: Anesthesiology

## 2019-12-10 ENCOUNTER — Encounter (HOSPITAL_COMMUNITY)
Admission: RE | Disposition: A | Discharge status: Home / Self Care | Payer: Self-pay | Source: Home / Self Care | Attending: Obstetrics & Gynecology

## 2019-12-10 ENCOUNTER — Other Ambulatory Visit: Payer: Self-pay

## 2019-12-10 ENCOUNTER — Inpatient Hospital Stay (HOSPITAL_COMMUNITY)
Admission: RE | Admit: 2019-12-10 | Discharge: 2019-12-12 | DRG: 785 | Disposition: A | Discharge status: Home / Self Care | Payer: 59 | Attending: Obstetrics & Gynecology | Admitting: Obstetrics & Gynecology

## 2019-12-10 DIAGNOSIS — Z302 Encounter for sterilization: Secondary | ICD-10-CM | POA: Diagnosis not present

## 2019-12-10 DIAGNOSIS — Z20822 Contact with and (suspected) exposure to covid-19: Secondary | ICD-10-CM | POA: Diagnosis present

## 2019-12-10 DIAGNOSIS — Z3A39 39 weeks gestation of pregnancy: Secondary | ICD-10-CM | POA: Diagnosis not present

## 2019-12-10 DIAGNOSIS — O24419 Gestational diabetes mellitus in pregnancy, unspecified control: Secondary | ICD-10-CM | POA: Diagnosis present

## 2019-12-10 DIAGNOSIS — O34211 Maternal care for low transverse scar from previous cesarean delivery: Secondary | ICD-10-CM | POA: Diagnosis present

## 2019-12-10 DIAGNOSIS — O24425 Gestational diabetes mellitus in childbirth, controlled by oral hypoglycemic drugs: Secondary | ICD-10-CM | POA: Diagnosis present

## 2019-12-10 DIAGNOSIS — N858 Other specified noninflammatory disorders of uterus: Secondary | ICD-10-CM | POA: Diagnosis not present

## 2019-12-10 DIAGNOSIS — Z98891 History of uterine scar from previous surgery: Secondary | ICD-10-CM

## 2019-12-10 LAB — GLUCOSE, CAPILLARY
Glucose-Capillary: 94 mg/dL (ref 70–99)
Glucose-Capillary: 92 mg/dL (ref 70–99)

## 2019-12-10 LAB — PREPARE RBC (CROSSMATCH)

## 2019-12-10 SURGERY — Surgical Case
Anesthesia: Spinal | Wound class: Clean Contaminated

## 2019-12-10 MED ORDER — KETOROLAC TROMETHAMINE 30 MG/ML IJ SOLN
30.0000 mg | Freq: Four times a day (QID) | INTRAMUSCULAR | Status: AC
  Administered 2019-12-10 – 2019-12-11 (×3): 30 mg via INTRAVENOUS
  Filled 2019-12-10 (×3): qty 1

## 2019-12-10 MED ORDER — ACETAMINOPHEN 500 MG PO TABS
ORAL_TABLET | ORAL | Status: AC
  Filled 2019-12-10: qty 2

## 2019-12-10 MED ORDER — OXYTOCIN 40 UNITS IN NORMAL SALINE INFUSION - SIMPLE MED
INTRAVENOUS | Status: AC
  Filled 2019-12-10: qty 1000

## 2019-12-10 MED ORDER — TETANUS-DIPHTH-ACELL PERTUSSIS 5-2.5-18.5 LF-MCG/0.5 IM SUSP: 0.5000 mL | Freq: Once | INTRAMUSCULAR | Status: DC

## 2019-12-10 MED ORDER — KETOROLAC TROMETHAMINE 30 MG/ML IJ SOLN
30.0000 mg | Freq: Four times a day (QID) | INTRAMUSCULAR | Status: AC | PRN
  Administered 2019-12-10: 30 mg via INTRAVENOUS

## 2019-12-10 MED ORDER — SCOPOLAMINE 1 MG/3DAYS TD PT72
MEDICATED_PATCH | TRANSDERMAL | Status: AC
  Filled 2019-12-10: qty 1

## 2019-12-10 MED ORDER — SODIUM CHLORIDE 0.9 % IV SOLN
INTRAVENOUS | Status: AC
  Filled 2019-12-10: qty 2

## 2019-12-10 MED ORDER — MEPERIDINE HCL 25 MG/ML IJ SOLN: 6.2500 mg | INTRAMUSCULAR | Status: DC | PRN

## 2019-12-10 MED ORDER — SODIUM CHLORIDE 0.9% FLUSH: 3.0000 mL | INTRAVENOUS | Status: DC | PRN

## 2019-12-10 MED ORDER — LACTATED RINGERS IV SOLN: INTRAVENOUS | Status: DC

## 2019-12-10 MED ORDER — KETOROLAC TROMETHAMINE 30 MG/ML IJ SOLN: 30.0000 mg | Freq: Four times a day (QID) | INTRAMUSCULAR | Status: AC | PRN

## 2019-12-10 MED ORDER — WITCH HAZEL-GLYCERIN EX PADS: 1.0000 "application " | MEDICATED_PAD | CUTANEOUS | Status: DC | PRN

## 2019-12-10 MED ORDER — ZOLPIDEM TARTRATE 5 MG PO TABS: 5.0000 mg | ORAL_TABLET | Freq: Every evening | ORAL | Status: DC | PRN

## 2019-12-10 MED ORDER — BUPIVACAINE IN DEXTROSE 0.75-8.25 % IT SOLN
INTRATHECAL | Status: DC | PRN
  Administered 2019-12-10: 1.8 mL via INTRATHECAL

## 2019-12-10 MED ORDER — PHENYLEPHRINE HCL-NACL 20-0.9 MG/250ML-% IV SOLN
INTRAVENOUS | Status: AC
  Filled 2019-12-10: qty 250

## 2019-12-10 MED ORDER — SODIUM CHLORIDE 0.9 % IV SOLN
INTRAVENOUS | Status: DC | PRN
Start: 2019-12-10 — End: 2019-12-10

## 2019-12-10 MED ORDER — OXYTOCIN 40 UNITS IN NORMAL SALINE INFUSION - SIMPLE MED
INTRAVENOUS | Status: DC | PRN
  Administered 2019-12-10: 40 [IU] via INTRAVENOUS

## 2019-12-10 MED ORDER — NALBUPHINE HCL 10 MG/ML IJ SOLN: 5.0000 mg | INTRAMUSCULAR | Status: DC | PRN

## 2019-12-10 MED ORDER — SCOPOLAMINE 1 MG/3DAYS TD PT72
1.0000 | MEDICATED_PATCH | Freq: Once | TRANSDERMAL | Status: DC
  Administered 2019-12-10: 1.5 mg via TRANSDERMAL

## 2019-12-10 MED ORDER — MORPHINE SULFATE (PF) 0.5 MG/ML IJ SOLN
INTRAMUSCULAR | Status: DC | PRN
  Administered 2019-12-10: .15 mg via INTRATHECAL

## 2019-12-10 MED ORDER — NALBUPHINE HCL 10 MG/ML IJ SOLN: 5.0000 mg | Freq: Once | INTRAMUSCULAR | Status: DC | PRN

## 2019-12-10 MED ORDER — KETOROLAC TROMETHAMINE 30 MG/ML IJ SOLN
INTRAMUSCULAR | Status: AC
  Filled 2019-12-10: qty 1

## 2019-12-10 MED ORDER — NALOXONE HCL 4 MG/10ML IJ SOLN
1.0000 ug/kg/h | INTRAVENOUS | Status: DC | PRN
  Filled 2019-12-10: qty 5

## 2019-12-10 MED ORDER — SOD CITRATE-CITRIC ACID 500-334 MG/5ML PO SOLN
30.0000 mL | ORAL | Status: AC
  Administered 2019-12-10: 30 mL via ORAL

## 2019-12-10 MED ORDER — ONDANSETRON HCL 4 MG/2ML IJ SOLN
INTRAMUSCULAR | Status: DC | PRN
  Administered 2019-12-10: 4 mg via INTRAVENOUS

## 2019-12-10 MED ORDER — ONDANSETRON HCL 4 MG/2ML IJ SOLN
INTRAMUSCULAR | Status: AC
  Filled 2019-12-10: qty 2

## 2019-12-10 MED ORDER — DIPHENHYDRAMINE HCL 50 MG/ML IJ SOLN
INTRAMUSCULAR | Status: AC
  Filled 2019-12-10: qty 1

## 2019-12-10 MED ORDER — DIPHENHYDRAMINE HCL 50 MG/ML IJ SOLN: 12.5000 mg | INTRAMUSCULAR | Status: DC | PRN

## 2019-12-10 MED ORDER — OXYTOCIN 40 UNITS IN NORMAL SALINE INFUSION - SIMPLE MED: 2.5000 [IU]/h | INTRAVENOUS | Status: AC

## 2019-12-10 MED ORDER — NALOXONE HCL 0.4 MG/ML IJ SOLN: 0.4000 mg | INTRAMUSCULAR | Status: DC | PRN

## 2019-12-10 MED ORDER — GABAPENTIN 300 MG PO CAPS
300.0000 mg | ORAL_CAPSULE | ORAL | Status: AC
  Administered 2019-12-10: 300 mg via ORAL

## 2019-12-10 MED ORDER — FENTANYL CITRATE (PF) 100 MCG/2ML IJ SOLN
25.0000 ug | INTRAMUSCULAR | Status: DC | PRN
  Administered 2019-12-10: 25 ug via INTRAVENOUS
  Administered 2019-12-10: 50 ug via INTRAVENOUS

## 2019-12-10 MED ORDER — MENTHOL 3 MG MT LOZG: 1.0000 | LOZENGE | OROMUCOSAL | Status: DC | PRN

## 2019-12-10 MED ORDER — SODIUM CHLORIDE 0.9 % IR SOLN
Status: DC | PRN
  Administered 2019-12-10: 1

## 2019-12-10 MED ORDER — PROMETHAZINE HCL 25 MG/ML IJ SOLN
6.2500 mg | Freq: Once | INTRAMUSCULAR | Status: AC
  Administered 2019-12-10: 6.25 mg via INTRAVENOUS

## 2019-12-10 MED ORDER — OXYCODONE HCL 5 MG PO TABS
5.0000 mg | ORAL_TABLET | ORAL | Status: DC | PRN
  Administered 2019-12-11: 10 mg via ORAL
  Filled 2019-12-10: qty 2

## 2019-12-10 MED ORDER — ACETAMINOPHEN 10 MG/ML IV SOLN
1000.0000 mg | Freq: Once | INTRAVENOUS | Status: DC | PRN
  Administered 2019-12-10: 1000 mg via INTRAVENOUS

## 2019-12-10 MED ORDER — PHENYLEPHRINE HCL-NACL 20-0.9 MG/250ML-% IV SOLN
INTRAVENOUS | Status: DC | PRN
  Administered 2019-12-10: 40 ug/min via INTRAVENOUS

## 2019-12-10 MED ORDER — ACETAMINOPHEN 10 MG/ML IV SOLN
INTRAVENOUS | Status: AC
  Filled 2019-12-10: qty 100

## 2019-12-10 MED ORDER — PRENATAL MULTIVITAMIN CH
1.0000 | ORAL_TABLET | Freq: Every day | ORAL | Status: DC
  Administered 2019-12-11 – 2019-12-12 (×2): 1 via ORAL
  Filled 2019-12-10 (×2): qty 1

## 2019-12-10 MED ORDER — FENTANYL CITRATE (PF) 100 MCG/2ML IJ SOLN
INTRAMUSCULAR | Status: DC | PRN
  Administered 2019-12-10: 15 ug via INTRATHECAL

## 2019-12-10 MED ORDER — OXYCODONE-ACETAMINOPHEN 5-325 MG PO TABS
2.0000 | ORAL_TABLET | ORAL | Status: DC | PRN
  Administered 2019-12-12 (×2): 2 via ORAL
  Filled 2019-12-10 (×2): qty 2

## 2019-12-10 MED ORDER — SODIUM CHLORIDE 0.9 % IV SOLN
2.0000 g | INTRAVENOUS | Status: AC
  Administered 2019-12-10: 2 g via INTRAVENOUS

## 2019-12-10 MED ORDER — SENNOSIDES-DOCUSATE SODIUM 8.6-50 MG PO TABS
2.0000 | ORAL_TABLET | ORAL | Status: DC
  Administered 2019-12-11 (×2): 2 via ORAL
  Filled 2019-12-10 (×2): qty 2

## 2019-12-10 MED ORDER — KETOROLAC TROMETHAMINE 30 MG/ML IJ SOLN: 30.0000 mg | Freq: Once | INTRAMUSCULAR | Status: DC | PRN

## 2019-12-10 MED ORDER — LACTATED RINGERS IV SOLN: INTRAVENOUS | Status: DC | PRN

## 2019-12-10 MED ORDER — GABAPENTIN 300 MG PO CAPS
ORAL_CAPSULE | ORAL | Status: AC
  Filled 2019-12-10: qty 1

## 2019-12-10 MED ORDER — MAGNESIUM HYDROXIDE 400 MG/5ML PO SUSP: 30.0000 mL | ORAL | Status: DC | PRN

## 2019-12-10 MED ORDER — METOCLOPRAMIDE HCL 5 MG/ML IJ SOLN
10.0000 mg | Freq: Once | INTRAMUSCULAR | Status: AC
  Administered 2019-12-10: 10 mg via INTRAVENOUS

## 2019-12-10 MED ORDER — DIPHENHYDRAMINE HCL 25 MG PO CAPS: 25.0000 mg | ORAL_CAPSULE | Freq: Four times a day (QID) | ORAL | Status: DC | PRN

## 2019-12-10 MED ORDER — ONDANSETRON HCL 4 MG/2ML IJ SOLN
4.0000 mg | Freq: Three times a day (TID) | INTRAMUSCULAR | Status: DC | PRN
  Administered 2019-12-10 – 2019-12-11 (×2): 4 mg via INTRAVENOUS
  Filled 2019-12-10: qty 2

## 2019-12-10 MED ORDER — TRAMADOL HCL 50 MG PO TABS: 50.0000 mg | ORAL_TABLET | Freq: Four times a day (QID) | ORAL | Status: DC | PRN

## 2019-12-10 MED ORDER — MEASLES, MUMPS & RUBELLA VAC IJ SOLR: 0.5000 mL | Freq: Once | INTRAMUSCULAR | Status: DC

## 2019-12-10 MED ORDER — TRANEXAMIC ACID-NACL 1000-0.7 MG/100ML-% IV SOLN
1000.0000 mg | INTRAVENOUS | Status: AC
  Administered 2019-12-10: 1000 mg via INTRAVENOUS

## 2019-12-10 MED ORDER — FENTANYL CITRATE (PF) 100 MCG/2ML IJ SOLN
INTRAMUSCULAR | Status: AC
  Filled 2019-12-10: qty 2

## 2019-12-10 MED ORDER — FERROUS SULFATE 325 (65 FE) MG PO TABS
325.0000 mg | ORAL_TABLET | Freq: Two times a day (BID) | ORAL | Status: DC
  Administered 2019-12-11 – 2019-12-12 (×3): 325 mg via ORAL
  Filled 2019-12-10 (×3): qty 1

## 2019-12-10 MED ORDER — SIMETHICONE 80 MG PO CHEW
80.0000 mg | CHEWABLE_TABLET | ORAL | Status: DC
  Administered 2019-12-11 (×2): 80 mg via ORAL
  Filled 2019-12-10 (×2): qty 1

## 2019-12-10 MED ORDER — PHENYLEPHRINE 40 MCG/ML (10ML) SYRINGE FOR IV PUSH (FOR BLOOD PRESSURE SUPPORT)
PREFILLED_SYRINGE | INTRAVENOUS | Status: AC
  Filled 2019-12-10: qty 10

## 2019-12-10 MED ORDER — ENOXAPARIN SODIUM 40 MG/0.4ML ~~LOC~~ SOLN
40.0000 mg | SUBCUTANEOUS | Status: DC
  Administered 2019-12-11: 40 mg via SUBCUTANEOUS
  Filled 2019-12-10: qty 0.4

## 2019-12-10 MED ORDER — ACETAMINOPHEN 500 MG PO TABS
1000.0000 mg | ORAL_TABLET | Freq: Four times a day (QID) | ORAL | Status: AC
  Administered 2019-12-11: 1000 mg via ORAL
  Filled 2019-12-10: qty 2

## 2019-12-10 MED ORDER — HYDROMORPHONE HCL 1 MG/ML IJ SOLN: 1.0000 mg | INTRAMUSCULAR | Status: DC | PRN

## 2019-12-10 MED ORDER — PROMETHAZINE HCL 25 MG/ML IJ SOLN: 6.2500 mg | Freq: Once | INTRAMUSCULAR | Status: DC

## 2019-12-10 MED ORDER — GABAPENTIN 300 MG PO CAPS
300.0000 mg | ORAL_CAPSULE | Freq: Two times a day (BID) | ORAL | Status: DC
  Administered 2019-12-11 – 2019-12-12 (×3): 300 mg via ORAL
  Filled 2019-12-10 (×4): qty 1

## 2019-12-10 MED ORDER — PROMETHAZINE HCL 25 MG/ML IJ SOLN
INTRAMUSCULAR | Status: AC
  Filled 2019-12-10: qty 1

## 2019-12-10 MED ORDER — DEXAMETHASONE SODIUM PHOSPHATE 4 MG/ML IJ SOLN
INTRAMUSCULAR | Status: AC
  Filled 2019-12-10: qty 1

## 2019-12-10 MED ORDER — METOCLOPRAMIDE HCL 5 MG/ML IJ SOLN
INTRAMUSCULAR | Status: AC
  Filled 2019-12-10: qty 2

## 2019-12-10 MED ORDER — MORPHINE SULFATE (PF) 0.5 MG/ML IJ SOLN: INTRAMUSCULAR | Status: DC | PRN

## 2019-12-10 MED ORDER — SIMETHICONE 80 MG PO CHEW: 80.0000 mg | CHEWABLE_TABLET | ORAL | Status: DC | PRN

## 2019-12-10 MED ORDER — SOD CITRATE-CITRIC ACID 500-334 MG/5ML PO SOLN
ORAL | Status: AC
  Filled 2019-12-10: qty 30

## 2019-12-10 MED ORDER — MORPHINE SULFATE (PF) 0.5 MG/ML IJ SOLN
INTRAMUSCULAR | Status: AC
  Filled 2019-12-10: qty 10

## 2019-12-10 MED ORDER — TRANEXAMIC ACID-NACL 1000-0.7 MG/100ML-% IV SOLN
INTRAVENOUS | Status: AC
  Filled 2019-12-10: qty 200

## 2019-12-10 MED ORDER — IBUPROFEN 800 MG PO TABS
800.0000 mg | ORAL_TABLET | Freq: Four times a day (QID) | ORAL | Status: DC
  Administered 2019-12-11 – 2019-12-12 (×5): 800 mg via ORAL
  Filled 2019-12-10 (×5): qty 1

## 2019-12-10 MED ORDER — DEXAMETHASONE SODIUM PHOSPHATE 4 MG/ML IJ SOLN
4.0000 mg | Freq: Once | INTRAMUSCULAR | Status: AC
  Administered 2019-12-10: 4 mg via INTRAVENOUS

## 2019-12-10 MED ORDER — DIBUCAINE (PERIANAL) 1 % EX OINT: 1.0000 "application " | TOPICAL_OINTMENT | CUTANEOUS | Status: DC | PRN

## 2019-12-10 MED ORDER — PHENYLEPHRINE HCL (PRESSORS) 10 MG/ML IV SOLN
INTRAVENOUS | Status: DC | PRN
  Administered 2019-12-10: 80 ug via INTRAVENOUS
  Administered 2019-12-10: 40 ug via INTRAVENOUS

## 2019-12-10 MED ORDER — COCONUT OIL OIL: 1.0000 "application " | TOPICAL_OIL | Status: DC | PRN

## 2019-12-10 MED ORDER — STERILE WATER FOR IRRIGATION IR SOLN
Status: DC | PRN
  Administered 2019-12-10: 1000 mL

## 2019-12-10 MED ORDER — ACETAMINOPHEN 500 MG PO TABS
1000.0000 mg | ORAL_TABLET | ORAL | Status: AC
  Administered 2019-12-10: 1000 mg via ORAL

## 2019-12-10 MED ORDER — DIPHENHYDRAMINE HCL 25 MG PO CAPS: 25.0000 mg | ORAL_CAPSULE | ORAL | Status: DC | PRN

## 2019-12-10 SURGICAL SUPPLY — 32 items
CHLORAPREP W/TINT 26ML (MISCELLANEOUS) ×3 IMPLANT
CLAMP CORD UMBIL (MISCELLANEOUS) IMPLANT
CLIP FILSHIE TUBAL LIGA STRL (Clip) ×3 IMPLANT
CLOTH BEACON ORANGE TIMEOUT ST (SAFETY) ×3 IMPLANT
DRSG OPSITE POSTOP 4X10 (GAUZE/BANDAGES/DRESSINGS) ×3 IMPLANT
ELECT REM PT RETURN 9FT ADLT (ELECTROSURGICAL) ×3
ELECTRODE REM PT RTRN 9FT ADLT (ELECTROSURGICAL) ×1 IMPLANT
EXTRACTOR VACUUM M CUP 4 TUBE (SUCTIONS) IMPLANT
EXTRACTOR VACUUM M CUP 4' TUBE (SUCTIONS)
GLOVE BIOGEL PI IND STRL 7.0 (GLOVE) ×3 IMPLANT
GLOVE BIOGEL PI INDICATOR 7.0 (GLOVE) ×6
GLOVE ECLIPSE 7.0 STRL STRAW (GLOVE) ×3 IMPLANT
GOWN STRL REUS W/TWL LRG LVL3 (GOWN DISPOSABLE) ×6 IMPLANT
KIT ABG SYR 3ML LUER SLIP (SYRINGE) IMPLANT
NEEDLE HYPO 22GX1.5 SAFETY (NEEDLE) ×3 IMPLANT
NEEDLE HYPO 25X5/8 SAFETYGLIDE (NEEDLE) ×3 IMPLANT
NS IRRIG 1000ML POUR BTL (IV SOLUTION) ×3 IMPLANT
PACK C SECTION WH (CUSTOM PROCEDURE TRAY) ×3 IMPLANT
PAD ABD 7.5X8 STRL (GAUZE/BANDAGES/DRESSINGS) ×6 IMPLANT
PAD OB MATERNITY 4.3X12.25 (PERSONAL CARE ITEMS) ×3 IMPLANT
PENCIL SMOKE EVAC W/HOLSTER (ELECTROSURGICAL) ×3 IMPLANT
RTRCTR C-SECT PINK 25CM LRG (MISCELLANEOUS) IMPLANT
SPONGE GAUZE 4X4 12PLY STER LF (GAUZE/BANDAGES/DRESSINGS) ×3 IMPLANT
SUT PDS AB 0 CTX 36 PDP370T (SUTURE) ×6 IMPLANT
SUT PLAIN 2 0 XLH (SUTURE) IMPLANT
SUT VIC AB 0 CTX 36 (SUTURE) ×4
SUT VIC AB 0 CTX36XBRD ANBCTRL (SUTURE) ×2 IMPLANT
SUT VIC AB 4-0 KS 27 (SUTURE) ×3 IMPLANT
SYR CONTROL 10ML LL (SYRINGE) ×3 IMPLANT
TOWEL OR 17X24 6PK STRL BLUE (TOWEL DISPOSABLE) ×3 IMPLANT
TRAY FOLEY W/BAG SLVR 14FR LF (SET/KITS/TRAYS/PACK) ×3 IMPLANT
WATER STERILE IRR 1000ML POUR (IV SOLUTION) ×3 IMPLANT

## 2019-12-10 NOTE — Anesthesia Preprocedure Evaluation (Addendum)
Anesthesia Evaluation  Patient identified by MRN, date of birth, ID band Patient awake    Reviewed: Allergy & Precautions, NPO status , Patient's Chart, lab work & pertinent test results  Airway Mallampati: II  TM Distance: >3 FB Neck ROM: Full    Dental no notable dental hx. (+) Teeth Intact, Dental Advisory Given   Pulmonary neg pulmonary ROS,    Pulmonary exam normal breath sounds clear to auscultation       Cardiovascular negative cardio ROS Normal cardiovascular exam Rhythm:Regular Rate:Normal     Neuro/Psych negative neurological ROS  negative psych ROS   GI/Hepatic negative GI ROS, Neg liver ROS,   Endo/Other  negative endocrine ROSdiabetes, Gestational, Oral Hypoglycemic Agents  Renal/GU negative Renal ROS  negative genitourinary   Musculoskeletal negative musculoskeletal ROS (+)   Abdominal   Peds  Hematology  (+) Blood dyscrasia (Hgb 10.3, plt 491), anemia ,   Anesthesia Other Findings [id number] for repeat C/S  Reproductive/Obstetrics (+) Pregnancy                            Anesthesia Physical Anesthesia Plan  ASA: III  Anesthesia Plan: Spinal   Post-op Pain Management:    Induction:   PONV Risk Score and Plan: Treatment may vary due to age or medical condition  Airway Management Planned: Natural Airway  Additional Equipment:   Intra-op Plan:   Post-operative Plan:   Informed Consent: I have reviewed the patients History and Physical, chart, labs and discussed the procedure including the risks, benefits and alternatives for the proposed anesthesia with the patient or authorized representative who has indicated his/her understanding and acceptance.     Dental advisory given  Plan Discussed with: CRNA  Anesthesia Plan Comments:         Anesthesia Quick Evaluation

## 2019-12-10 NOTE — H&P (Signed)
Obstetric Preoperative History and Physical  Robin Conley is a 34 y.o. [id number] with IUP at 93w0dpresenting for scheduled repeat cesarean section and bilateral tubal sterilization. Had A2GDM.  Reports good fetal movement, no bleeding, no contractions, no leaking of fluid.  No acute preoperative concerns.    Cesarean Section Indication: Previous cesarean section x 3, undesired fertility  Prenatal Course Source of Care: [facility] Pregnancy complications or risks: Patient Active Problem List   Diagnosis Date Noted  . Gestational diabetes mellitus (GDM) in third trimester 10/08/2019  . Supervision of other high risk pregnancy, antepartum 08/29/2019  . History of cesarean delivery x 3 08/29/2019    Nursing Staff Provider  Office Location [facility]  Dating  LMP  Language  English  Anatomy UKorea normal  Flu Vaccine  Declined 08/29/19 Genetic Screen  NIPS: low risk  TDaP vaccine  Declined 10/01/19 GTT Third trimester GDM  Rhogam  NA   LAB RESULTS   Feeding Plan Breast and bottle Blood Type  O POS (05/02 0838) O  Contraception BTL Antibody NEG (05/02 05573  Circumcision Yes, if boy  Rubella 2.41 (01/21 1053)immune  Pediatrician  [facility] RPR NON REACTIVE (05/02 0838)  Support Person Enoc(husband) HBsAg Negative (01/21 1053)   Prenatal Classes No HIV Non Reactive (02/25 0816)  BTL Consent Private insurance GBS Negative/-- (04/15 1024)n  BP Cuff Received on 08/29/19 Pap Normal    Past Medical History:  Diagnosis Date  . Gestational diabetes     Past Surgical History:  Procedure Laterality Date  . CESAREAN SECTION  2013  . CESAREAN SECTION N/A 10/30/2017   Procedure: REPEAT CESAREAN SECTION;  Surgeon: MCheri Fowler MD;  Location: [facility]  Service: Obstetrics;  Laterality: N/A;  Tracey RNFA  . CESAREAN SECTION  2011    OB History  Gravida Para Term Preterm AB Living  _0 SAB TAB Ectopic Multiple Live Births        0 4    # Outcome Date GA Lbr Len/2nd Weight Sex  Delivery Anes PTL Lv  5 Current           4 Term 10/30/17 [id number] g F CS-LTranv EPI  LIV  3 Term 2013 389w0d487 g F CS-LTranv   LIV  2 Term 2011 37w49w0d22 g M CSBerenice BoutonIV  1 Term 2009 38w048w0d5 g M Vag-Spont   LIV    Social History   Socioeconomic History  . Marital status: Married    Spouse name: Not on file  . Number of children: Not on file  . Years of education: Not on file  . Highest education level: Not on file  Occupational History  . Not on file  Tobacco Use  . Smoking status: Never Smoker  . Smokeless tobacco: Never Used  Substance and Sexual Activity  . Alcohol use: No  . Drug use: No  . Sexual activity: Yes    Birth control/protection: None  Other Topics Concern  . Not on file  Social History Narrative  . Not on file   Social Determinants of Health   Financial Resource Strain:   . Difficulty of Paying Living Expenses:   Food Insecurity:   . Worried About RunniCharity fundraiserhe Last Year:   . Ran OArboriculturisthe Last Year:   Transportation Needs:   . Lack Film/video editorical):   . LacMarland Kitchen of Transportation (  Non-Medical):   Physical Activity:   . Days of Exercise per Week:   . Minutes of Exercise per Session:   Stress:   . Feeling of Stress :   Social Connections:   . Frequency of Communication with Friends and Family:   . Frequency of Social Gatherings with Friends and Family:   . Attends Religious Services:   . Active Member of Clubs or Organizations:   . Attends Archivist Meetings:   Marland Kitchen Marital Status:     Family History  Problem Relation Age of Onset  . Diabetes Mother   . Hypertension Mother   . Heart disease Mother   . Hypertension Father   . Heart disease Father     Facility-Administered Medications Prior to Admission  Medication Dose Route Frequency Provider Last Rate Last Admin  . metFORMIN (GLUCOPHAGE) tablet 500 mg  500 mg Oral QHS Seabron Spates, CNM       Medications Prior to Admission   Medication Sig Dispense Refill Last Dose  . metFORMIN (GLUCOPHAGE) 500 MG tablet Take 1 tablet (500 mg total) by mouth daily with breakfast. 7 tablet 0   . Prenatal Vit-Fe Fumarate-FA (PRENATAL PO) Take 1 tablet by mouth daily.     . Accu-Chek Softclix Lancets lancets 1 each by Other route 4 (four) times daily. 100 each 12   . Blood Glucose Monitoring Suppl (ACCU-CHEK GUIDE) w/Device KIT daily. as directed     . Continuous Blood Gluc Receiver (FREESTYLE LIBRE 2 READER) DEVI 1 Units by Does not apply route in the morning, at noon, in the evening, and at bedtime. 1 each 0   . Continuous Blood Gluc Sensor (FREESTYLE LIBRE 2 SENSOR) MISC 1 Units by Does not apply route continuous. 2 each 1   . glucose blood (ACCU-CHEK GUIDE) test strip Use as instructed 100 each 12   . ibuprofen (ADVIL,MOTRIN) 600 MG tablet Take 1 tablet (600 mg total) by mouth every 6 (six) hours. (Patient not taking: Reported on 11/26/2019) 30 tablet 0   . oxyCODONE (OXY IR/ROXICODONE) 5 MG immediate release tablet Take 1 tablet (5 mg total) by mouth every 4 (four) hours as needed for severe pain. (Patient not taking: Reported on 11/26/2019) 15 tablet 0     No Known Allergies  Review of Systems: Pertinent items noted in HPI and remainder of comprehensive ROS otherwise negative.  Physical Exam: BP 111/78   Pulse 92   Temp 98.2 F (36.8 C) (Oral)   Resp 17   Ht _0  (1.575 m)   Wt 91.6 kg   LMP 03/12/2019 (Exact Date)   BMI 36.95 kg/m  FHR by Doppler: 145 bpm CONSTITUTIONAL: Well-developed, well-nourished female in no acute distress.  HENT:  Normocephalic, atraumatic, External right and left ear normal. Oropharynx is clear and moist EYES: Conjunctivae and EOM are normal. Pupils are equal, round, and reactive to light. No scleral icterus.  NECK: Normal range of motion, supple, no masses SKIN: Skin is warm and dry. No rash noted. Not diaphoretic. No erythema. No pallor. [place]: Alert and oriented to person, place, and  time. Normal reflexes, muscle tone coordination. No cranial nerve deficit noted. PSYCHIATRIC: Normal mood and affect. Normal behavior. Normal judgment and thought content. CARDIOVASCULAR: Normal heart rate noted, regular rhythm RESPIRATORY: Effort and breath sounds normal, no problems with respiration noted ABDOMEN: Soft, nontender, nondistended, gravid. Well-healed Pfannenstiel incision. PELVIC: Deferred MUSCULOSKELETAL: Normal range of motion. No edema and no tenderness. 2+ distal pulses.   Pertinent Labs/Studies:  Results for orders placed or performed during the hospital encounter of 12/10/19 (from the past 168 hour(s))  Prepare RBC (crossmatch)   Collection Time: 12/10/19 10:22 AM  Result Value Ref Range   Order Confirmation      ORDER PROCESSED BY BLOOD BANK Performed at [facility], [address] [place], [place] [address]   Glucose, capillary   Collection Time: 12/10/19 10:38 AM  Result Value Ref Range   Glucose-Capillary 94 70 - 99 mg/dL  Results for orders placed or performed during the hospital encounter of 12/08/19 (from the past 168 hour(s))  SARS CORONAVIRUS 2 (TAT 6-24 HRS) Nasopharyngeal Nasopharyngeal Swab   Collection Time: 12/08/19  8:28 AM   Specimen: Nasopharyngeal Swab  Result Value Ref Range   SARS Coronavirus 2 NEGATIVE NEGATIVE  Basic metabolic panel   Collection Time: 12/08/19  8:38 AM  Result Value Ref Range   Sodium 136 135 - 145 mmol/L   Potassium 3.6 3.5 - 5.1 mmol/L   Chloride 104 98 - 111 mmol/L   CO2 23 22 - 32 mmol/L   Glucose, Bld 91 70 - 99 mg/dL   BUN 10 6 - 20 mg/dL   Creatinine, Ser 0.56 0.44 - 1.00 mg/dL   Calcium 9.1 8.9 - 10.3 mg/dL   GFR calc non Af Amer >60 >60 mL/min   GFR calc Af Amer >60 >60 mL/min   Anion gap 9 5 - 15  CBC   Collection Time: 12/08/19  8:38 AM  Result Value Ref Range   WBC 11.6 (H) 4.0 - 10.5 K/uL   RBC 3.60 (L) 3.87 - 5.11 MIL/uL   Hemoglobin 10.3 (L) 12.0 - 15.0 g/dL   HCT 31.5 (L) 36.0 - 46.0  %   MCV 87.5 80.0 - 100.0 fL   MCH 28.6 26.0 - 34.0 pg   MCHC 32.7 30.0 - 36.0 g/dL   RDW 14.4 11.5 - 15.5 %   Platelets 491 (H) 150 - 400 K/uL   nRBC 0.0 0.0 - 0.2 %  RPR   Collection Time: 12/08/19  8:38 AM  Result Value Ref Range   RPR Ser Ql NON REACTIVE NON REACTIVE  Type and screen   Collection Time: 12/08/19  8:38 AM  Result Value Ref Range   ABO/RH(D) O POS    Antibody Screen NEG    Sample Expiration      12/11/2019,2359 Performed at [facility], [place] [address], [place], [place] [address]    Unit Number [id number]    Blood Component Type RED CELLS,LR    Unit division 00    Status of Unit ALLOCATED    Transfusion Status OK TO TRANSFUSE    Crossmatch Result Compatible    Unit Number [id number]    Blood Component Type RED CELLS,LR    Unit division 00    Status of Unit ALLOCATED    Transfusion Status OK TO TRANSFUSE    Crossmatch Result Compatible   ABO/Rh   Collection Time: 12/08/19  8:38 AM  Result Value Ref Range   ABO/RH(D)      O POS Performed at [place] [address] [place], [place] [address]   BPAM [facility]   Collection Time: 12/08/19  8:38 AM  Result Value Ref Range   ISSUE DATE / TIME [id number]    Blood Product Unit Number [id number]    PRODUCT CODE [id number]    Unit Type and Rh 5100    Blood Product Expiration Date [id number]  ISSUE DATE / TIME [id number]    Blood Product Unit Number [id number]    PRODUCT CODE [id number]    Unit Type and Rh 5100    Blood Product Expiration Date [id number]     Assessment and Plan: JISELE PRICE is a 34 y.o. G5P4004 at 82w0dbeing admitted for scheduled repeat cesarean section and bilateral tubal sterilization. The risks of cesarean section were discussed with the patient including but were not limited to: bleeding which may require transfusion or reoperation; infection which may require antibiotics; injury to bowel, bladder, ureters or other surrounding organs; injury to the  fetus; need for additional procedures including hysterectomy in the event of a life-threatening hemorrhage; placental abnormalities wth subsequent pregnancies, incisional problems, thromboembolic phenomenon and other postoperative/anesthesia complications.  Patient also desires permanent sterilization.  She was told this will be either done via salpingectomy or Filshie clips depending on exposure and adhesive disease.  Other reversible forms of contraception had been discussed with patient; she declines all other modalities. Risks of procedure discussed with patient including but not limited to: risk of regret, permanence of method, bleeding, infection, injury to surrounding organs and need for additional procedures.  Failure risk of about 1% with increased risk of ectopic gestation if pregnancy occurs was also discussed with patient.  Also discussed possibility of post-tubal pain syndrome. The patient concurred with the proposed plan, giving informed written consent for the procedures.  Patient has been NPO since last night she will remain NPO for procedure. Anesthesia and OR aware.  Preoperative prophylactic antibiotics, TXA and SCDs ordered on call to the OR.  She is typed and crossmatched for 2 pRBCs. To OR when ready.   UVerita Schneiders MD, FNorristownfor WDe[employer] [place]

## 2019-12-10 NOTE — Anesthesia Procedure Notes (Signed)
Spinal  Patient location during procedure: OR Start time: 12/10/2019 11:25 AM End time: 12/10/2019 11:29 AM Preanesthetic Checklist Completed: patient identified, IV checked, site marked, risks and benefits discussed, surgical consent, monitors and equipment checked, pre-op evaluation and timeout performed Spinal Block Patient position: sitting Prep: ChloraPrep Patient monitoring: heart rate, cardiac monitor, continuous pulse ox and blood pressure Approach: midline Location: L3-4 Needle Needle type: Pencil-Tip  Needle gauge: 24 G Needle length: 5 cm Assessment Sensory level: T10 Additional Notes +FFCSF. Positive aspiration. 1.38mL 0.75% Bupi + glucose. Fentanyl + Doramorph

## 2019-12-10 NOTE — Lactation Note (Addendum)
This note was copied from a baby's chart. Lactation Consultation Note  Patient Name: Robin Conley ACZYS'A Date: 12/10/2019 Reason for consult: Initial assessment;Term;Maternal endocrine disorder Type of Endocrine Disorder?: Diabetes P5, 8 hour term female infant. Mom's hx: C/S delivery and GDM Mom's feeding choice at admission was breast and formula feeding. Mom is active on the [facility] program in [facility] and has DEBP at home. Mom is experienced at breastfeeding, she breastfeed her 1st child for one year, did not breastfeed her 2nd or 3rd child but did breastfeed her 4th child who is 107 years old for one year as well. Per mom, she feels breastfeeding is going well, infant breastfeed 5 minutes in recovery, 10 minutes in the room and this will be infant's 3rd time latching at the breast. Infant was cuing to breastfeed when [facility] entered the room. Mom latched infant on her right breast using the football hold position, mom brought infant towards breast, nose and chin touching breast, nostrils free, swallows observed, infant was still breastfeeding after 6 minutes when LC left room. Mom will continue to breastfeed infant STS and do breast compressions while breastfeeding infant. Mom knows to breastfeed infant on demand, by cues, 8-12+ times within 24 hours and not exceed 3 hours without breastfeeding infant. Mom knows to call RN or LC if she needs assistance with latching infant at breast. LC discussed community resources after hospital discharge: [facility] hotline, [facility] outpatient clinic and [facility] online breastfeeding support group.  Maternal Data Formula Feeding for Exclusion: Yes Reason for exclusion: Mother's choice to formula and breast feed on admission Has patient been taught Hand Expression?: Yes Does the patient have breastfeeding experience prior to this delivery?: Yes  Feeding Feeding Type: Breast Fed  LATCH Score Latch: Grasps breast easily, tongue down, lips flanged, rhythmical  sucking.  Audible Swallowing: Spontaneous and intermittent  Type of Nipple: Everted at rest and after stimulation  Comfort (Breast/Nipple): Soft / non-tender  Hold (Positioning): Assistance needed to correctly position infant at breast and maintain latch.  LATCH Score: 9  Interventions Interventions: Breast feeding basics reviewed;Breast compression;Assisted with latch;Adjust position;Skin to skin;Support pillows;Breast massage;Position options;DEBP  Lactation Tools Discussed/Used WIC Program: Yes   Consult Status Consult Status: Follow-up Date: 12/11/19 Follow-up type: In-patient    Danelle Earthly 12/10/2019, 8:40 PM

## 2019-12-10 NOTE — Anesthesia Postprocedure Evaluation (Signed)
Anesthesia Post Note  Patient: Addeline L Onstad  Procedure(s) Performed: CESAREAN SECTION (N/A )     Patient location during evaluation: PACU Anesthesia Type: Spinal Level of consciousness: oriented and awake and alert Pain management: pain level controlled Vital Signs Assessment: post-procedure vital signs reviewed and stable Respiratory status: spontaneous breathing, respiratory function stable and patient connected to nasal cannula oxygen Cardiovascular status: blood pressure returned to baseline and stable Postop Assessment: no headache, no backache and no apparent nausea or vomiting Anesthetic complications: no    Last Vitals:  Vitals:   12/10/19 1429 12/10/19 1444  BP:  102/74  Pulse: 71 67  Resp: 16 18  Temp: (!) 36.3 C (!) 36.3 C  SpO2: 97% 100%    Last Pain:  Vitals:   12/10/19 1444  TempSrc: Oral  PainSc:    Pain Goal:    LLE Motor Response: Purposeful movement (12/10/19 1429) LLE Sensation: Tingling (12/10/19 1429) RLE Motor Response: Purposeful movement (12/10/19 1429) RLE Sensation: Tingling (12/10/19 1429)     Epidural/Spinal Function Cutaneous sensation: Tingles (12/10/19 1429), Patient able to flex knees: Yes (12/10/19 1429), Patient able to lift hips off bed: No (12/10/19 1429), Back pain beyond tenderness at insertion site: No (12/10/19 1429), Progressively worsening motor and/or sensory loss: No (12/10/19 1429), Bowel and/or bladder incontinence post epidural: No (12/10/19 1429)  Deriana Vanderhoef L Shavon Ashmore

## 2019-12-10 NOTE — Discharge Summary (Signed)
Postpartum Discharge Summary     Patient Name: Robin Conley DOB: 07/18/1986 MRN: [id number]  Date of admission: 12/10/2019 Delivering Provider: Verita Schneiders A   Date of discharge: 12/12/2019  Admitting diagnosis: S/P cesarean section [Z98.891] Intrauterine pregnancy: [redacted]w[redacted]d    Secondary diagnosis:  Active Problems:   Postpartum care following cesarean delivery   History of cesarean delivery x 3   Gestational diabetes mellitus (GDM) in third trimester  Additional problems: none     Discharge diagnosis: Term Pregnancy Delivered and GDM A2                                                                                                Post partum procedures:postpartum tubal ligation  Augmentation: N/A  Complications: None  Hospital course:  Scheduled C/S   34y.o. yo G5P5005 at 335w0das admitted to the hospital 12/10/2019 for scheduled cesarean section and BTS with Filshie clips with the following indication:Elective Repeat.  Membrane Rupture Time/Date: 11:57 AM ,12/10/2019   Patient delivered a Viable infant.12/10/2019  Details of operation can be found in separate operative note.  Patient had an uncomplicated postpartum course. On POD#1 her Hgb was 9.6 and had been 10.3 on admission; her FBS was 104. She is ambulating, tolerating a regular diet, passing flatus, and urinating well. Patient is discharged home in stable condition on  12/12/19 per her request for early discharge as long as baby can go too.        Delivery time: 11:58 AM    Magnesium Sulfate received: No BMZ received: No Rhophylac:N/A MMR:N/A Transfusion:No  Physical exam  Vitals:   12/11/19 0500 12/11/19 0853 12/11/19 2200 12/12/19 0555  BP: 99/67 102/73 104/62 99/68  Pulse: 76 77 79 98  Resp: 16  16 18   Temp: 98 F (36.7 C) 97.9 F (36.6 C) 98.3 F (36.8 C) 98.4 F (36.9 C)  TempSrc: Oral Oral Oral Oral  SpO2: 99% 100% 99%   Weight:      Height:       General: alert, cooperative and no  distress Lochia: appropriate Uterine Fundus: firm Incision: Healing well with no significant drainage; pressure dsg still intact/dry DVT Evaluation: No evidence of DVT seen on physical exam. Labs: Lab Results  Component Value Date   WBC 15.8 (H) 12/11/2019   HGB 9.6 (L) 12/11/2019   HCT 29.1 (L) 12/11/2019   MCV 87.9 12/11/2019   PLT 486 (H) 12/11/2019   CMP Latest Ref Rng & Units 12/11/2019  Glucose 70 - 99 mg/dL 104(H)  BUN 6 - 20 mg/dL 10  Creatinine 0.44 - 1.00 mg/dL 0.57  Sodium 135 - 145 mmol/L 135  Potassium 3.5 - 5.1 mmol/L 3.5  Chloride 98 - 111 mmol/L 104  CO2 22 - 32 mmol/L 23  Calcium 8.9 - 10.3 mg/dL 8.8(L)  Total Protein 6.5 - 8.1 g/dL 5.5(L)  Total Bilirubin 0.3 - 1.2 mg/dL 0.4  Alkaline Phos 38 - 126 U/L 99  AST 15 - 41 U/L 23  ALT 0 - 44 U/L 12   Edinburgh Score: Edinburgh Postnatal Depression Scale Screening Tool 12/12/2019  I have been able to laugh and see the funny side of things. 0  I have looked forward with enjoyment to things. 0  I have blamed myself unnecessarily when things went wrong. 1  I have been anxious or worried for no good reason. 1  I have felt scared or panicky for no good reason. 1  Things have been getting on top of me. 0  I have been so unhappy that I have had difficulty sleeping. 0  I have felt sad or miserable. 1  I have been so unhappy that I have been crying. 0  The thought of harming myself has occurred to me. 0  Edinburgh Postnatal Depression Scale Total 4    Discharge instruction: per After Visit Summary and "Baby and Me Booklet".  After visit meds:  Allergies as of 12/12/2019   No Known Allergies     Medication List    STOP taking these medications   Accu-Chek Guide test strip Generic drug: glucose blood   Accu-Chek Guide w/Device Kit   Accu-Chek Softclix Lancets lancets   FreeStyle Libre 2 [employer] 2 Sensor Misc   metFORMIN 500 MG tablet Commonly known as: GLUCOPHAGE     TAKE these  medications   ibuprofen 800 MG tablet Commonly known as: ADVIL Take 1 tablet (800 mg total) by mouth every 8 (eight) hours as needed. What changed:   medication strength  how much to take  when to take this  reasons to take this   oxyCODONE 5 MG immediate release tablet Commonly known as: Roxicodone Take 1 tablet (5 mg total) by mouth every 4 (four) hours as needed for up to 7 days. What changed: reasons to take this   PRENATAL PO Take 1 tablet by mouth daily.       Diet: routine diet  Activity: Advance as tolerated. Pelvic rest for 6 weeks.   Outpatient follow WG:[id number] check in 1 wk; PP visit in 4wks with GTT Follow up Appt: Future Appointments  Date Time Provider [place]  12/26/2019  3:30 PM Truett Mainland, DO [facility] None  01/16/2020 10:00 AM Lavonia Drafts, MD [facility] None   Follow up Visit:  Please schedule this patient for Postpartum visit in: 4 weeks with the following provider: Any provider In-Person For C/S patients schedule nurse incision check in weeks 2 weeks: yes High risk pregnancy complicated by: GDM Delivery mode:  CS Anticipated Birth Control:  BTL done PP Procedures needed: 2 hour GTT  Schedule Integrated BH visit: no   Newborn Data: Live born female  Birth Weight: 7 lb 3.9 oz (3285 g) APGAR: 8, 9  Newborn Delivery   Birth date/time: 12/10/2019 11:58:00 Delivery type: C-Section, Low Transverse Trial of labor: No C-section categorization: Repeat      Baby Feeding: Breast Disposition:home with mother   12/12/2019 Myrtis Ser, CNM  8:51 AM

## 2019-12-10 NOTE — Transfer of Care (Signed)
Immediate Anesthesia Transfer of Care Note  Patient: Robin Conley  Procedure(s) Performed: CESAREAN SECTION (N/A )  Patient Location: PACU  Anesthesia Type:Spinal  Level of Consciousness: awake  Airway & Oxygen Therapy: Patient Spontanous Breathing  Post-op Assessment: Report given to RN  Post vital signs: Reviewed and stable  Last Vitals:  Vitals Value Taken Time  BP 105/70 12/10/19 1239  Temp    Pulse 66 12/10/19 1242  Resp 17 12/10/19 1242  SpO2 99 % 12/10/19 1242  Vitals shown include unvalidated device data.  Last Pain:  Vitals:   12/10/19 1029  TempSrc:   PainSc: 0-No pain         Complications: No apparent anesthesia complications

## 2019-12-10 NOTE — Op Note (Signed)
Robin Conley PROCEDURE DATE: 12/10/2019  PREOPERATIVE DIAGNOSES: Intrauterine pregnancy at [redacted]w[redacted]d weeks gestation; previous cesarean section x 3; undesired fertility  POSTOPERATIVE DIAGNOSES: The same  PROCEDURE: Repeat Low Transverse Cesarean Section, Bilateral Tubal Sterilization using Filshie clips  SURGEON:  Dr. Jaynie Collins  ASSISTANT:  Dr. Marcy Siren  ANESTHESIOLOGIST: Dr. Roxan Hockey  INDICATIONS: Robin Conley is a 34 y.o. [id number] at [redacted]w[redacted]d here for repeat cesarean section and bilateral tubal sterilization secondary to the indications listed under preoperative diagnoses; please see preoperative note for further details.  The risks of surgery were discussed with the patient including but were not limited to: bleeding which may require transfusion or reoperation; infection which may require antibiotics; injury to bowel, bladder, ureters or other surrounding organs; injury to the fetus; need for additional procedures including hysterectomy in the event of a life-threatening hemorrhage; placental abnormalities wth subsequent pregnancies, incisional problems, thromboembolic phenomenon and other postoperative/anesthesia complications.  Patient also desires permanent sterilization.  Other reversible forms of contraception were discussed with patient; she declines all other modalities. Risks of procedure discussed with patient including but not limited to: risk of regret, permanence of method, bleeding, infection, injury to surrounding organs and need for additional procedures.  Failure risk of 1-2% with increased risk of ectopic gestation if pregnancy occurs was also discussed with patient.  Also discussed possibility of post-tubal pain syndrome. The patient concurred with the proposed plan, giving informed written consent for the procedures.    FINDINGS:  Viable female infant in cephalic presentation.  Apgars pending documentation at time of note but baby is stable in the OR with  patient.  Clear amniotic fluid.  Intact placenta, three vessel cord.  Normal uterus, fallopian tubes and ovaries bilaterally. Fallopian tubes were sterilized bilaterally. Minimal intraperitoneal adhesive disease; just tightly adherent vesicouterine peritoneum.   ANESTHESIA: Spinal ESTIMATED BLOOD LOSS: 337 ml SPECIMENS: Placenta sent to L&D COMPLICATIONS: None immediate  PROCEDURE IN DETAIL:  The patient preoperatively received intravenous antibiotics and had sequential compression devices applied to her lower extremities.   She was then taken to the operating room where spinal anesthesia was administered and was found to be adequate. She was then placed in a dorsal supine position with a leftward tilt, and prepped and draped in a sterile manner.  A foley catheter was placed into her bladder and attached to constant gravity.  After an adequate timeout was performed, a Pfannenstiel skin incision was made with scalpel over her preexisting scar and carried through to the underlying layer of fascia. The fascia was incised in the midline, and this incision was extended bilaterally using the Mayo scissors.  Kocher clamps were applied to the superior aspect of the fascial incision and the underlying rectus muscles were dissected off sharply.  A similar process was carried out on the inferior aspect of the fascial incision. The rectus muscles were separated in the midline and the peritoneum was entered sharply. In order to get adequate exposure, the rectus muscles were transected about 1.5 cm bilaterally from each medial edge.  The Alexis self-retaining retractor was introduced into the abdominal cavity.  Attention was turned to the lower uterine segment where a low transverse hysterotomy was made with a scalpel and extended bilaterally bluntly.  The infant was successfully delivered, the cord was clamped and cut after one minute, and the infant was handed over to the awaiting neonatology team. Uterine massage was  then administered, and the placenta delivered intact with a three-vessel cord. The uterus was then cleared  of clots and debris.  The hysterotomy was closed with 0 Vicryl in a running locked fashion, and an imbricating layer was also placed with 0 Vicryl.   Attention was then turned to the fallopian tubes and the underlying mesosalpinges were very engorged.  Filshie clips were carefully placed about 3 cm from the cornua of both fallopian tubes, with care given to incorporate the underlying mesosalpinx on both sides, allowing for bilateral tubal sterilization. Excellent hemostasis was noted. The pelvis was cleared of all clot and debris. Hemostasis was confirmed on all surfaces.  The retractor was removed.  The rectus muscles were reapproximated using a 0 Vicryl interrupted stitch. The fascia was then closed using 0 PDS in a running fashion.  The subcutaneous layer was irrigated, and the skin was closed with a 4-0 Vicryl subcuticular stitch. The patient tolerated the procedure well. Sponge, instrument and needle counts were correct x 3.  She was taken to the recovery room in stable condition.    Verita Schneiders, MD, [place] for [employer], [place]

## 2019-12-11 LAB — CBC
HCT: 29.1 % — ABNORMAL LOW (ref 36.0–46.0)
Hemoglobin: 9.6 g/dL — ABNORMAL LOW (ref 12.0–15.0)
MCH: 29 pg (ref 26.0–34.0)
MCHC: 33 g/dL (ref 30.0–36.0)
MCV: 87.9 fL (ref 80.0–100.0)
Platelets: 486 10*3/uL — ABNORMAL HIGH (ref 150–400)
RBC: 3.31 MIL/uL — ABNORMAL LOW (ref 3.87–5.11)
RDW: 14.1 % (ref 11.5–15.5)
WBC: 15.8 10*3/uL — ABNORMAL HIGH (ref 4.0–10.5)
nRBC: 0 % (ref 0.0–0.2)

## 2019-12-11 LAB — COMPREHENSIVE METABOLIC PANEL
ALT: 12 U/L (ref 0–44)
AST: 23 U/L (ref 15–41)
Albumin: 2.3 g/dL — ABNORMAL LOW (ref 3.5–5.0)
Alkaline Phosphatase: 99 U/L (ref 38–126)
Anion gap: 8 (ref 5–15)
BUN: 10 mg/dL (ref 6–20)
CO2: 23 mmol/L (ref 22–32)
Calcium: 8.8 mg/dL — ABNORMAL LOW (ref 8.9–10.3)
Chloride: 104 mmol/L (ref 98–111)
Creatinine, Ser: 0.57 mg/dL (ref 0.44–1.00)
GFR calc Af Amer: >60 mL/min (ref >60)
GFR calc non Af Amer: >60 mL/min (ref >60)
Glucose, Bld: 104 mg/dL — ABNORMAL HIGH (ref 70–99)
Potassium: 3.5 mmol/L (ref 3.5–5.1)
Sodium: 135 mmol/L (ref 135–145)
Total Bilirubin: 0.4 mg/dL (ref 0.3–1.2)
Total Protein: 5.5 g/dL — ABNORMAL LOW (ref 6.5–8.1)

## 2019-12-11 LAB — BIRTH TISSUE RECOVERY COLLECTION (PLACENTA DONATION)

## 2019-12-11 NOTE — Lactation Note (Signed)
This note was copied from a baby's chart. Lactation Consultation Note  Patient Name: Robin Conley [id number] Date: 12/11/2019 Reason for consult: Term;Follow-up assessment;Infant weight loss Type of Endocrine Disorder?: Diabetes P5, 34 hour term female infant with -6% weight loss. Per mom, infant had one stool and 3 void diapers today. Infant has started to cluster feed, per mom she last breastfed infant one hour ago for 20 minutes, infant was cuing to breastfed when [facility] entered the room, mom undress infant and latched infant on her left breast using the cradle hold position. LC observed infant swallows  " cuh ' sound heard and  LC did not assist mom with latch. Infant breastfeed for 10 minutes , LC discussed hand expression with mom, that  she can give infant back extra volume after breastfeeding  infant at breast and it can help stabilize weight.  Mom did hand expression using a foley cup  and infant, mom re-latch infant back at breast prior to [facility] leaving room , most feeding currently  are 20 minutes in length or longer. Mom feels breastfeeding is going well and LC did not see any trauma to mom's nipples , mom will continue to breastfeed infant on demand, by cues, 8-12+ times and not exceed 3 hours without breastfeeding infant.   Maternal Data    Feeding Feeding Type: Breast Fed  LATCH Score Latch: Grasps breast easily, tongue down, lips flanged, rhythmical sucking.  Audible Swallowing: Spontaneous and intermittent  Type of Nipple: Everted at rest and after stimulation  Comfort (Breast/Nipple): Soft / non-tender  Hold (Positioning): No assistance needed to correctly position infant at breast.  LATCH Score: 10  Interventions Interventions: Hand express;Expressed milk;Skin to skin  Lactation Tools Discussed/Used     Consult Status Consult Status: Follow-up Date: 12/12/19 Follow-up type: In-patient    Danelle Earthly 12/11/2019, 10:30 PM

## 2019-12-11 NOTE — Progress Notes (Signed)
Results for REGINIA, BATTIE (MRN [id number]) as of 12/11/2019 04:51  Ref. Range 12/11/2019 04:51  Glucose Latest Ref Range: 70 - 99 mg/dL 251 (H)   Fasting CBG deferred r/t morning lab which included blood glucose. Result is 104.  Elvia Collum, RN

## 2019-12-11 NOTE — Progress Notes (Signed)
POSTPARTUM PROGRESS NOTE  Subjective: Robin Conley is a 34 y.o. [id number] s/p rLTCS and BTL at [redacted]w[redacted]d.  She reports she doing well. No acute events overnight. She denies any problems with ambulating or po intake. She still has a foley catheter inserted. Denies nausea or vomiting. She has passed flatus. Pain is well controlled.  Lochia is normal.  Objective: Blood pressure 99/67, pulse 76, temperature 98 F (36.7 C), temperature source Oral, resp. rate 16, height 5\' 2"  (1.575 m), weight 91.6 kg, last menstrual period 03/12/2019, SpO2 99 %, unknown if currently breastfeeding.  Physical Exam:  General: alert, cooperative and no distress Chest: no respiratory distress Abdomen: soft, non-tender  Uterine Fundus: firm, appropriately tender Extremities: No calf swelling or tenderness  no edema  Recent Labs    12/08/19 0838 12/11/19 0451  HGB 10.3* 9.6*  HCT 31.5* 29.1*    Assessment/Plan: Robin Conley is a 34 y.o. 34 s/p rLTCS and BTL at [redacted]w[redacted]d.  Routine Postpartum Care: Doing well, pain well-controlled.  -- Continue routine care, lactation support  -- Contraception: s/p BTL -- Feeding: Breast -- s/p rLTCS: Hgb stable at 9.6 this AM.   Dispo: Plan for discharge likely 5/6 afternoon.  [redacted]w[redacted]d, DO, PGY1

## 2019-12-12 LAB — TYPE AND SCREEN
ABO/RH(D): O POS
Antibody Screen: NEGATIVE
Unit division: 0
Unit division: 0

## 2019-12-12 LAB — BPAM RBC
Blood Product Expiration Date: 202105292359
Blood Product Expiration Date: 202105292359
ISSUE DATE / TIME: 202105030807
ISSUE DATE / TIME: 202105030807
Unit Type and Rh: 5100
Unit Type and Rh: 5100

## 2019-12-12 MED ORDER — OXYCODONE HCL 5 MG PO TABS: 5.0000 mg | ORAL_TABLET | ORAL | 0 refills | Status: AC | PRN

## 2019-12-12 MED ORDER — IBUPROFEN 800 MG PO TABS: 800.0000 mg | ORAL_TABLET | Freq: Three times a day (TID) | ORAL | 0 refills | Status: AC | PRN

## 2019-12-12 NOTE — Lactation Note (Signed)
This note was copied from a baby's chart. Lactation Consultation Note:  Mother is a P5, infant is 7 hours old and is now at 9 % wt loss.   LC arrived in mothers room and mother was cup feeding infant.  Assist mother with hand expressing and cup feeding infant. Infant took cup well and took 10 ml . Mother is breastfeeding and then hand expressing.   Staff nurse Irving Burton sat up DEBP for mother to breastfeed and then to post pump. Mother to supplement infant.    Reviewed hand expression with mother. Observed large drops of colostrum. Mother was given a harmony hand pump with instructions. Mothers nipples are erect with compressible breast tissue. No observed trama of mothers nipples.  Plan of Care : Breastfeed infant with feeding cues Supplement infant with ebm, using a cup or method of choice and  according to supplemental guidelines  Pump using a DEBP after each feeding for 15-20 mins.   Mother to continue to cue base feed infant and feed at least 8-12 times or more in 24 hours and advised to allow for cluster feeding infant as needed.   Mother to continue to due STS. Mother is aware of available LC services at [facility], BFSG'S, OP Dept, and phone # for questions or concerns about breastfeeding.  Mother receptive to all teaching and plan of care.    Patient Name: Robin Conley [id number] Date: 12/12/2019 Reason for consult: Follow-up assessment   Maternal Data    Feeding Feeding Type: Breast Milk  LATCH Score                   Interventions Interventions: Hand express;Pre-pump if needed;Hand pump;DEBP  Lactation Tools Discussed/Used Pump Review: Setup, frequency, and cleaning Initiated by:: ER Date initiated:: 12/12/19   Consult Status Consult Status: Follow-up    Stevan Born [facility] 12/12/2019, 12:32 PM

## 2019-12-12 NOTE — Discharge Instructions (Signed)
Cesarean Delivery, Care After This sheet gives you information about how to care for yourself after your procedure. Your health care provider may also give you more specific instructions. If you have problems or questions, contact your health care provider. What can I expect after the procedure? After the procedure, it is common to have:  A small amount of blood or clear fluid coming from the incision.  Some redness, swelling, and pain in your incision area.  Some abdominal pain and soreness.  Vaginal bleeding (lochia). Even though you did not have a vaginal delivery, you will still have vaginal bleeding and discharge.  Pelvic cramps.  Fatigue. You may have pain, swelling, and discomfort in the tissue between your vagina and your anus (perineum) if:  Your C-section was unplanned, and you were allowed to labor and push.  An incision was made in the area (episiotomy) or the tissue tore during attempted vaginal delivery. Follow these instructions at home: Incision care   Follow instructions from your health care provider about how to take care of your incision. Make sure you: ? Wash your hands with soap and water before you change your bandage (dressing). If soap and water are not available, use hand sanitizer. ? If you have a dressing, change it or remove it as told by your health care provider. ? Leave stitches (sutures), skin staples, skin glue, or adhesive strips in place. These skin closures may need to stay in place for 2 weeks or longer. If adhesive strip edges start to loosen and curl up, you may trim the loose edges. Do not remove adhesive strips completely unless your health care provider tells you to do that.  Check your incision area every day for signs of infection. Check for: ? More redness, swelling, or pain. ? More fluid or blood. ? Warmth. ? Pus or a bad smell.  Do not take baths, swim, or use a hot tub until your health care provider says it's okay. Ask your health  care provider if you can take showers.  When you cough or sneeze, hug a pillow. This helps with pain and decreases the chance of your incision opening up (dehiscing). Do this until your incision heals. Medicines  Take over-the-counter and prescription medicines only as told by your health care provider.  If you were prescribed an antibiotic medicine, take it as told by your health care provider. Do not stop taking the antibiotic even if you start to feel better.  Do not drive or use heavy machinery while taking prescription pain medicine. Lifestyle  Do not drink alcohol. This is especially important if you are breastfeeding or taking pain medicine.  Do not use any products that contain nicotine or tobacco, such as cigarettes, e-cigarettes, and chewing tobacco. If you need help quitting, ask your health care provider. Eating and drinking  Drink at least 8 eight-ounce glasses of water every day unless told not to by your health care provider. If you breastfeed, you may need to drink even more water.  Eat high-fiber foods every day. These foods may help prevent or relieve constipation. High-fiber foods include: ? Whole grain cereals and breads. ? Brown rice. ? Beans. ? Fresh fruits and vegetables. Activity   If possible, have someone help you care for your baby and help with household activities for at least a few days after you leave the hospital.  Return to your normal activities as told by your health care provider. Ask your health care provider what activities are safe for   you.  Rest as much as possible. Try to rest or take a nap while your baby is sleeping.  Do not lift anything that is heavier than 10 lbs (4.5 kg), or the limit that you were told, until your health care provider says that it is safe.  Talk with your health care provider about when you can engage in sexual activity. This may depend on your: ? Risk of infection. ? How fast you heal. ? Comfort and desire to  engage in sexual activity. General instructions  Do not use tampons or douches until your health care provider approves.  Wear loose, comfortable clothing and a supportive and well-fitting bra.  Keep your perineum clean and dry. Wipe from front to back when you use the toilet.  If you pass a blood clot, save it and call your health care provider to discuss. Do not flush blood clots down the toilet before you get instructions from your health care provider.  Keep all follow-up visits for you and your baby as told by your health care provider. This is important. Contact a health care provider if:  You have: ? A fever. ? Bad-smelling vaginal discharge. ? Pus or a bad smell coming from your incision. ? Difficulty or pain when urinating. ? A sudden increase or decrease in the frequency of your bowel movements. ? More redness, swelling, or pain around your incision. ? More fluid or blood coming from your incision. ? A rash. ? Nausea. ? Little or no interest in activities you used to enjoy. ? Questions about caring for yourself or your baby.  Your incision feels warm to the touch.  Your breasts turn red or become painful or hard.  You feel unusually sad or worried.  You vomit.  You pass a blood clot from your vagina.  You urinate more than usual.  You are dizzy or light-headed. Get help right away if:  You have: ? Pain that does not go away or get better with medicine. ? Chest pain. ? Difficulty breathing. ? Blurred vision or spots in your vision. ? Thoughts about hurting yourself or your baby. ? New pain in your abdomen or in one of your legs. ? A severe headache.  You faint.  You bleed from your vagina so much that you fill more than one sanitary pad in one hour. Bleeding should not be heavier than your heaviest period. Summary  After the procedure, it is common to have pain at your incision site, abdominal cramping, and slight bleeding from your vagina.  Check  your incision area every day for signs of infection.  Tell your health care provider about any unusual symptoms.  Keep all follow-up visits for you and your baby as told by your health care provider. This information is not intended to replace advice given to you by your health care provider. Make sure you discuss any questions you have with your health care provider. Document Revised: 01/31/2018 Document Reviewed: 01/31/2018 Elsevier Patient Education  2020 Elsevier Inc.  

## 2019-12-13 ENCOUNTER — Encounter: Payer: Medicaid Other | Admitting: Obstetrics & Gynecology

## 2019-12-17 ENCOUNTER — Inpatient Hospital Stay (HOSPITAL_COMMUNITY): Admit: 2019-12-17 | Payer: Medicaid Other

## 2019-12-19 ENCOUNTER — Ambulatory Visit (INDEPENDENT_AMBULATORY_CARE_PROVIDER_SITE_OTHER): Payer: 59 | Admitting: Family Medicine

## 2019-12-19 ENCOUNTER — Other Ambulatory Visit: Payer: Self-pay

## 2019-12-19 ENCOUNTER — Encounter: Payer: Self-pay | Admitting: Family Medicine

## 2019-12-19 ENCOUNTER — Ambulatory Visit: Payer: Medicaid Other | Admitting: Family Medicine

## 2019-12-19 VITALS — BP 104/68 | HR 72 | Ht 62.0 in | Wt 188.0 lb

## 2019-12-19 DIAGNOSIS — Z4889 Encounter for other specified surgical aftercare: Secondary | ICD-10-CM

## 2019-12-19 MED ORDER — CEPHALEXIN 500 MG PO CAPS
500.0000 mg | ORAL_CAPSULE | Freq: Three times a day (TID) | ORAL | 0 refills | Status: AC
Start: 2019-12-19 — End: 2019-12-24

## 2019-12-19 NOTE — Progress Notes (Signed)
   Subjective:    Patient ID: Robin Conley, female    DOB: [birth year], 34 y.o.   MRN: [id number]  HPI Patient is 7 days postpartum from cesarean delivery. Has lump on left lower incision site that is painful. No radiation to pain. Some drainage from incision, but clear fluid. Pain not worsening.    Review of Systems    BP 104/68   Pulse 72   Ht 5\' 2"  (1.575 m)   Wt 188 lb (85.3 kg)   LMP 03/12/2019 (Exact Date)   BMI 34.39 kg/m    Objective:   Physical Exam Vitals reviewed.  Constitutional:      Appearance: Normal appearance.  Cardiovascular:     Pulses: Normal pulses.  Abdominal:     Comments: Incision clean, dry, intact. A couple of small areas where the subdermal layer exposed on the right edge. Firm 4cm area on left lower edge of the incision. No erythema.   Skin:    Capillary Refill: Capillary refill takes 2 to 3 seconds.  Neurological:     General: No focal deficit present.     Mental Status: She is alert.  Psychiatric:        Mood and Affect: Mood normal.        Behavior: Behavior normal.        Thought Content: Thought content normal.        Judgment: Judgment normal.       Assessment & Plan:  1. Encounter for post surgical wound check Does not look infected, but will start keflex 500mg  TID x5 days. Keep incision clean and dry. F/u next week.

## 2019-12-26 ENCOUNTER — Other Ambulatory Visit: Payer: Self-pay

## 2019-12-26 ENCOUNTER — Encounter: Payer: Self-pay | Admitting: Family Medicine

## 2019-12-26 ENCOUNTER — Ambulatory Visit (INDEPENDENT_AMBULATORY_CARE_PROVIDER_SITE_OTHER): Payer: 59 | Admitting: Family Medicine

## 2019-12-26 NOTE — Progress Notes (Signed)
   Subjective:    Patient ID: Robin Conley, female    DOB: May 23, 1986, 34 y.o.   MRN: [id number]  HPI Patient seen for incision check. She is approximately 2 weeks postop from repeat c/s. She reports mild discomfort.    Review of Systems     Objective:   Physical Exam Vitals reviewed.  Constitutional:      Appearance: Normal appearance.  Abdominal:     Comments: Incision clean, dry, intact. No erythema or fluctuant areas.  Neurological:     Mental Status: She is alert.  Psychiatric:        Mood and Affect: Mood normal.        Behavior: Behavior normal.        Thought Content: Thought content normal.        Judgment: Judgment normal.       Assessment & Plan:  1. Postpartum care following cesarean delivery Incision with good healing. F/u in 2 weeks for PP appt.

## 2020-01-16 ENCOUNTER — Encounter: Payer: Self-pay | Admitting: Family Medicine

## 2020-01-16 ENCOUNTER — Other Ambulatory Visit: Payer: Self-pay

## 2020-01-16 ENCOUNTER — Ambulatory Visit (HOSPITAL_BASED_OUTPATIENT_CLINIC_OR_DEPARTMENT_OTHER): Payer: 59 | Admitting: Family Medicine

## 2020-01-16 ENCOUNTER — Ambulatory Visit: Payer: Medicaid Other | Admitting: Family Medicine

## 2020-01-16 DIAGNOSIS — Z8632 Personal history of gestational diabetes: Secondary | ICD-10-CM

## 2020-01-16 MED ORDER — FERROUS SULFATE 324 (65 FE) MG PO TBEC
1.0000 | DELAYED_RELEASE_TABLET | ORAL | 1 refills | Status: AC
Start: 2020-01-16 — End: ?

## 2020-01-16 NOTE — Progress Notes (Signed)
    Post Partum Visit Note  Robin Conley is a 34 y.o. [id number] female who presents for a postpartum visit. She is 5 weeks postpartum following a repeat cesarean section.  I have fully reviewed the prenatal and intrapartum course. The delivery was at 39 gestational weeks.  Anesthesia: spinal. Postpartum course has been uneventful. Baby is doing well. Baby is feeding by [employer] . Bleeding staining only. Bowel function is normal. Bladder function is normal. Patient is not sexually active. Contraception method is tubal ligation. Postpartum depression screening: negative.  No issues Mood is good  Review of Systems Pertinent positives and negative per HPI, all others reviewed and negative   Objective:  Blood pressure 92/61, pulse 84, height 5\' 2"  (1.575 m), weight 188 lb (85.3 kg), last menstrual period 03/12/2019, unknown if currently breastfeeding.  General:  alert, cooperative and appears stated age   Breasts:  deferred  Lungs: comfortable on room air, no increased work of breathing or tachypnea  Heart:  regular rate and rhythm  Abdomen: soft, non-tender; bowel sounds normal; no masses,  no organomegaly and surgical scar well healed without signs of infection   Vulva:  not evaluated  Vagina: not evaluated  Cervix:  not evaluated  Corpus: not examined  Adnexa:  not evaluated  Rectal Exam: Not performed.        Assessment:    Normal postpartum exam. Pap smear not done at today's visit.   Plan:   Essential components of care per ACOG recommendations:  1.  Mood and well being: Patient with negative depression screening today.   2. Infant care and feeding:  -Patient currently breastmilk feeding? Yes   3. Sexuality, contraception and birth spacing - s/p BTL w 05/12/2019 - reviewed importance of taking pregnancy test if any suspicion of pregnancy  4. Physical Recovery  - Discussed patients delivery and complications - Patient is safe to resume physical and sexual  activity  5.  Health Maintenance - Last pap smear done 05/2017, was normal, records in media tab. Discussed she is almost due and we could obtain today, prefers to wait, schedule for f/u in 4 months for repeat pap - Mammogram: not due  6. GDM - scheduled for 2hr GTT next week  06/2017, MD [facility] for [facility], [facility]

## 2020-01-21 ENCOUNTER — Ambulatory Visit: Payer: 59

## 2020-01-22 ENCOUNTER — Other Ambulatory Visit: Payer: Self-pay

## 2020-01-22 ENCOUNTER — Ambulatory Visit: Payer: 59

## 2020-01-22 DIAGNOSIS — Z8632 Personal history of gestational diabetes: Secondary | ICD-10-CM

## 2020-01-22 NOTE — Progress Notes (Signed)
Pt presents for PP 2 hr GTT. Pt was sent to the lab. Joushua Dugar l Hershy Flenner, CMA

## 2020-01-23 LAB — GLUCOSE TOLERANCE, 2 HOURS
Glucose, 2 hour: 93 mg/dL (ref 65–139)
Glucose, GTT - Fasting: 97 mg/dL (ref 65–99)

## 2021-07-16 IMAGING — US US MFM OB DETAIL+14 WK
1 series · 13 of 28 positions shown · non-contrast
Comparison: none

[Series 1: us mfm ob detail+14 wk · 13 of 118 slices shown]
[im 5/118]
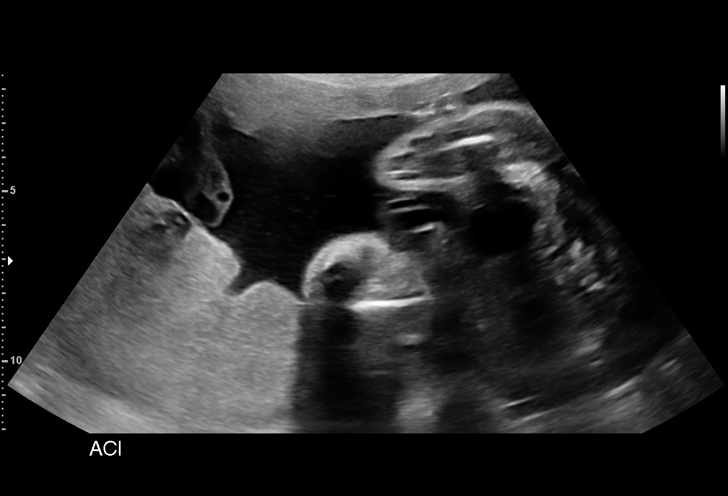
[im 14/118]
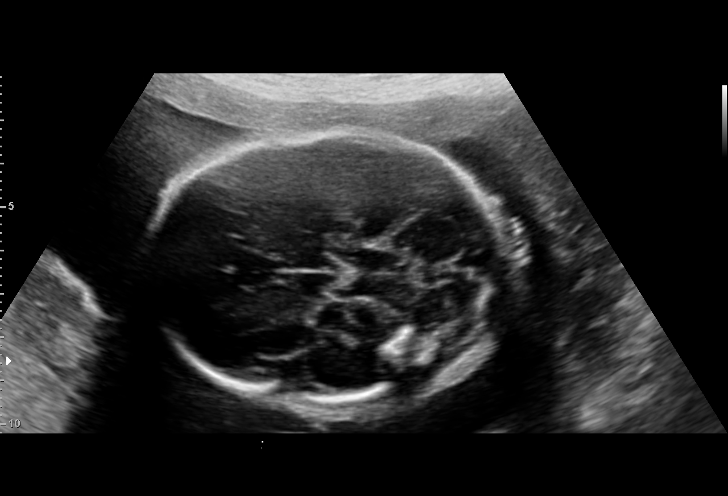
[im 22/118]
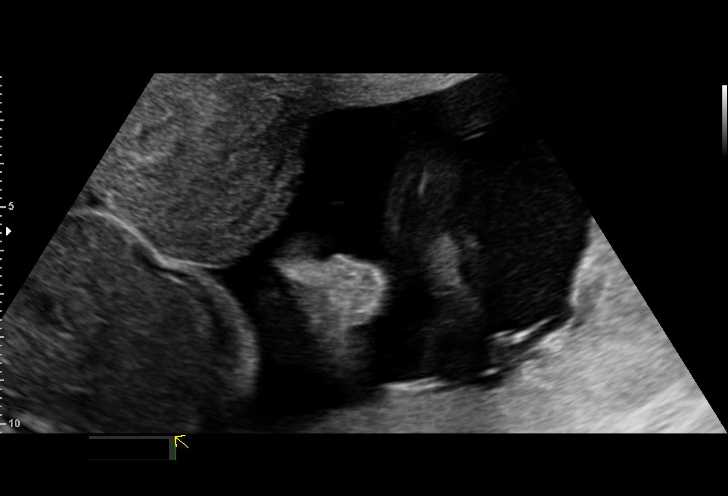
[im 31/118]
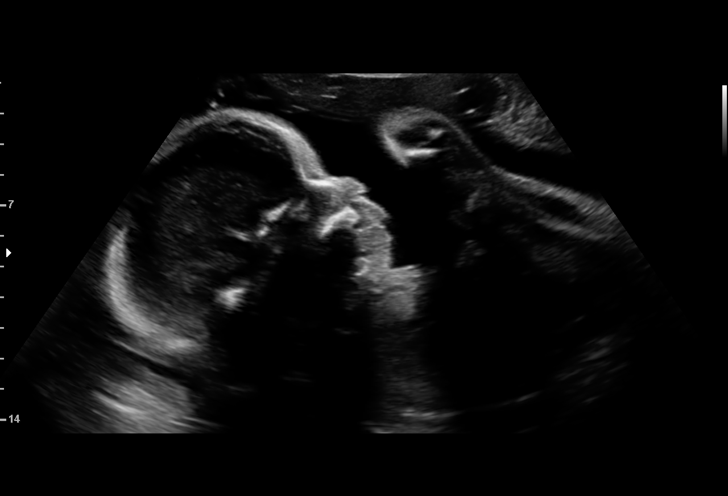
[im 40/118]
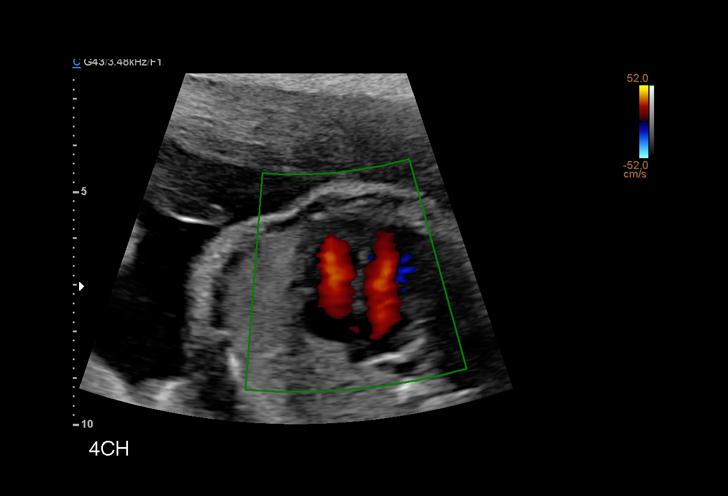
[im 48/118]
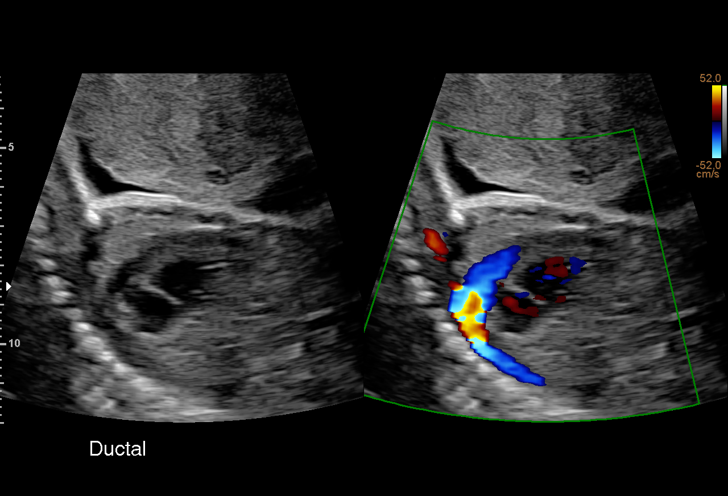
[im 61/118]
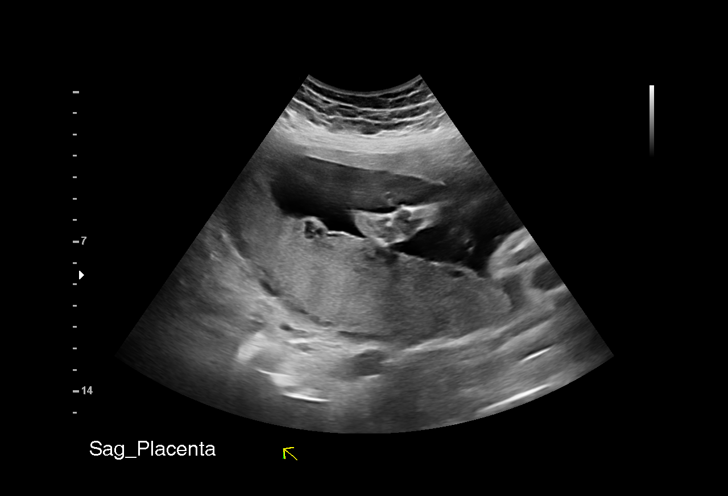
[im 70/118]
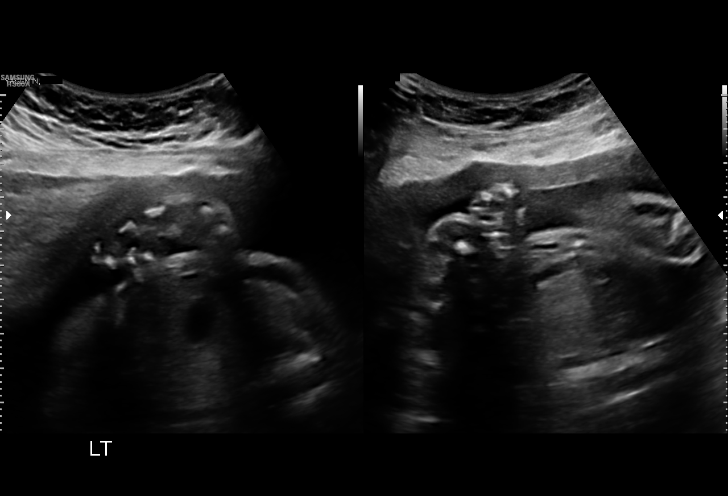
[im 79/118]
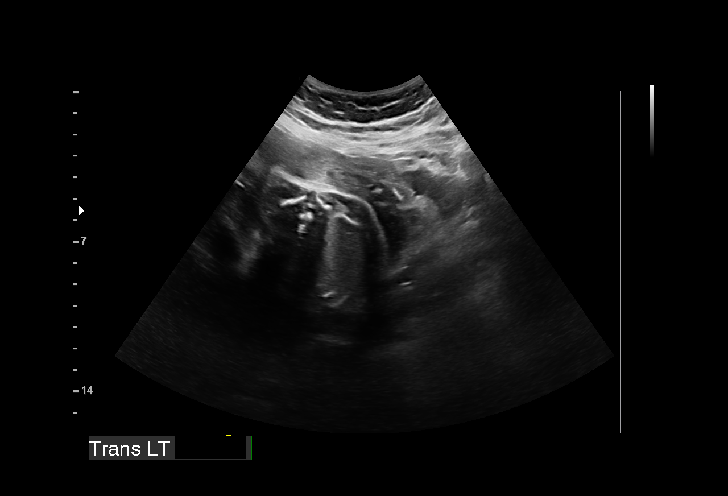
[im 87/118]
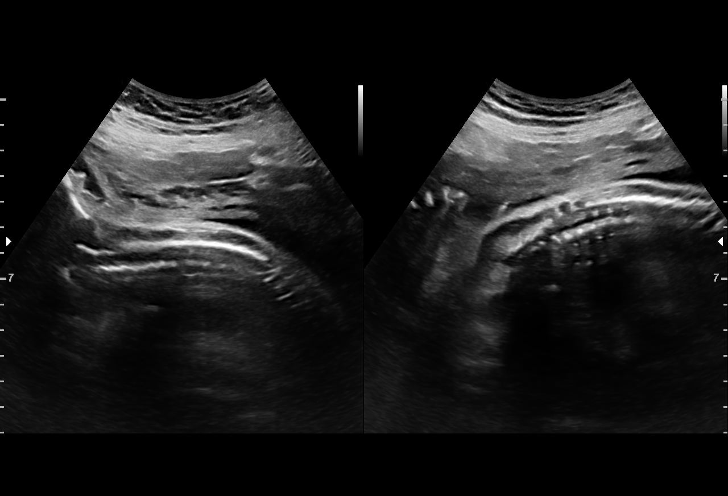
[im 96/118]
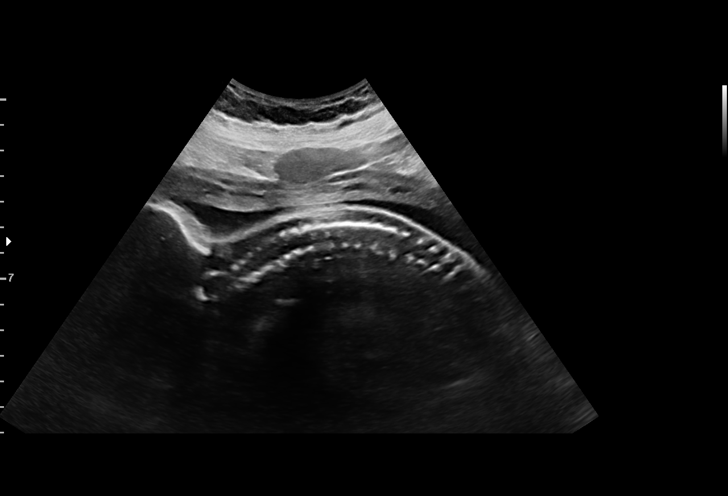
[im 105/118]
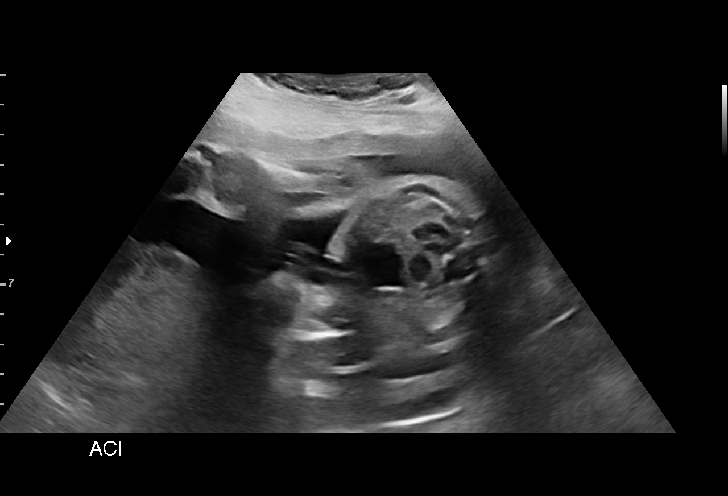
[im 113/118]
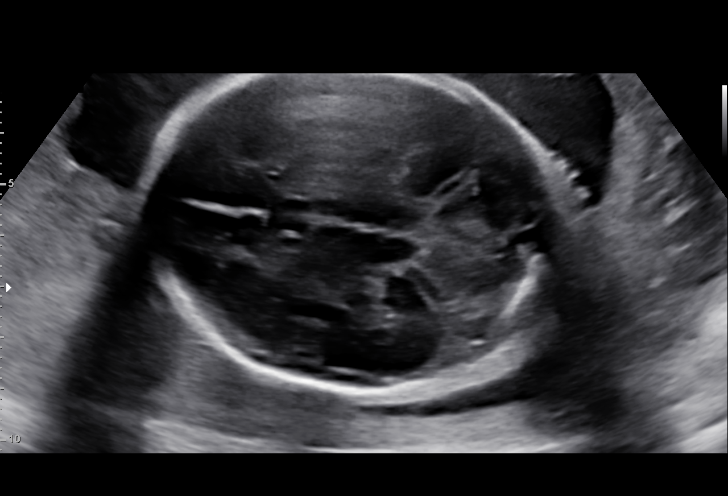

[13 of 28 positions shown; findings below may reference images not displayed]

----------------------------------------------------------------------

 ----------------------------------------------------------------------
Indications

  Obesity complicating pregnancy, second
  trimester (BMI:34)
  Insufficient Prenatal Care
  History of cesarean deliveryx2, currently
  pregnant
  Low risk NIPS, Fem, 14.5 % FF, Gen
  screen: pending
  27 weeks gestation of pregnancy
 ----------------------------------------------------------------------
Vital Signs

 BMI:
Fetal Evaluation

 Num Of Fetuses:         1
 Fetal Heart Rate(bpm):  153
 Cardiac Activity:       Observed
 Presentation:           Breech
 Placenta:               Posterior
 P. Cord Insertion:      Visualized, central

 Amniotic Fluid
 AFI FV:      Within normal limits

                             Largest Pocket(cm)

Biometry
 BPD:      64.5  mm     G. Age:  26w 1d         13  %    CI:        68.97   %    70 - 86
                                                         FL/HC:      19.9   %    18.6 -
 HC:      248.1  mm     G. Age:  27w 0d         21  %    HC/AC:      1.01        1.05 -
 AC:      245.2  mm     G. Age:  28w 5d         89  %    FL/BPD:     76.6   %    71 - 87
 FL:       49.4  mm     G. Age:  26w 5d         25  %    FL/AC:      20.1   %    20 - 24
 CER:      30.6  mm     G. Age:  26w 6d         46  %

 CM:        6.2  mm

 Est. FW:    6669  gm      2 lb 7 oz     67  %
OB History

 Gravidity:    5         Term:   4
 Living:       4
Gestational Age

 LMP:           27w 0d        Date:  03/12/19                 EDD:   12/17/19
 U/S Today:     27w 1d                                        EDD:   12/16/19
 Best:          27w 0d     Det. By:  LMP  (03/12/19)          EDD:   12/17/19
Anatomy

 Cranium:               Appears normal         LVOT:                   Appears normal
 Cavum:                 Appears normal         Aortic Arch:            Appears normal
 Ventricles:            Appears normal         Ductal Arch:            Appears normal
 Choroid Plexus:        Appears normal         Diaphragm:              Appears normal
 Cerebellum:            Appears normal         Stomach:                Appears normal, left
                                                                       sided
 Posterior Fossa:       Appears normal         Abdomen:                Appears normal
 Nuchal Fold:           Not applicable (>20    Abdominal Wall:         Appears nml (cord
                        wks GA)                                        insert, abd wall)
 Face:                  Appears normal         Cord Vessels:           Appears normal (3
                        (orbits and profile)                           vessel cord)
 Lips:                  Appears normal         Kidneys:                Appear normal
 Palate:                Appears normal         Bladder:                Appears normal
 Thoracic:              Appears normal         Spine:                  Appears normal
 Heart:                 Appears normal         Upper Extremities:      Appears normal
                        (4CH, axis, and
                        situs)
 RVOT:                  Appears normal         Lower Extremities:      Appears normal

 Other:  Female gender.  Nasal bone visualized. Heels visualized. Open
         hands visualized.
Cervix Uterus Adnexa

 Cervix
 Length:            4.4  cm.
 Normal appearance by transabdominal scan.

 Uterus
 No abnormality visualized.

 Left Ovary
 Within normal limits.

 Right Ovary
 Within normal limits.
 Cul De Sac
 No free fluid seen.

 Adnexa
 No adnexal mass visualized.
Comments

 This patient was seen for a detailed fetal anatomy scan due
 to maternal obesity.  She also presented late for prenatal
 care.
 She denies any significant past medical history and denies
 any problems in her current pregnancy.
 She had a cell free DNA test earlier in her pregnancy which
 indicated a low risk for trisomy 21, 18, and 13. A female fetus
 is predicted.
 She was informed that the fetal growth and amniotic fluid
 level were appropriate for her gestational age.
 There were no obvious fetal anomalies noted on today's
 ultrasound exam.
 The patient was informed that anomalies may be missed due
 to technical limitations. If the fetus is in a suboptimal position
 or maternal habitus is increased, visualization of the fetus in
 the maternal uterus may be impaired.
 Follow up as indicated.
